# Patient Record
Sex: Female | Born: 1955 | Hispanic: No | Marital: Married | Smoking: Never smoker
Health system: Southern US, Community
[De-identification: ages and names within clinical notes are randomized; demographics above are authoritative.]

## PROBLEM LIST (undated history)

## (undated) DIAGNOSIS — K635 Polyp of colon: Secondary | ICD-10-CM

## (undated) DIAGNOSIS — N920 Excessive and frequent menstruation with regular cycle: Secondary | ICD-10-CM

## (undated) DIAGNOSIS — K589 Irritable bowel syndrome without diarrhea: Secondary | ICD-10-CM

## (undated) DIAGNOSIS — D259 Leiomyoma of uterus, unspecified: Secondary | ICD-10-CM

## (undated) HISTORY — DX: Irritable bowel syndrome, unspecified: K58.9

## (undated) HISTORY — PX: NO PAST SURGERIES: SHX2092

## (undated) HISTORY — DX: Leiomyoma of uterus, unspecified: D25.9

## (undated) HISTORY — DX: Polyp of colon: K63.5

## (undated) HISTORY — DX: Excessive and frequent menstruation with regular cycle: N92.0

---

## 1995-10-10 HISTORY — PX: BREAST BIOPSY: SHX20

## 2005-03-16 ENCOUNTER — Ambulatory Visit: Payer: Self-pay | Admitting: Obstetrics and Gynecology

## 2008-02-08 ENCOUNTER — Ambulatory Visit: Payer: Self-pay

## 2009-04-16 ENCOUNTER — Ambulatory Visit: Payer: Self-pay | Admitting: Internal Medicine

## 2010-04-17 ENCOUNTER — Ambulatory Visit: Payer: Self-pay | Admitting: Internal Medicine

## 2011-03-11 ENCOUNTER — Ambulatory Visit: Payer: Self-pay | Admitting: Physician Assistant

## 2011-06-09 ENCOUNTER — Ambulatory Visit: Payer: Self-pay | Admitting: Internal Medicine

## 2012-06-15 ENCOUNTER — Ambulatory Visit: Payer: Self-pay | Admitting: Family Medicine

## 2013-07-19 ENCOUNTER — Ambulatory Visit: Payer: Self-pay | Admitting: Family Medicine

## 2013-09-26 ENCOUNTER — Ambulatory Visit: Payer: Self-pay | Admitting: Family Medicine

## 2013-10-09 ENCOUNTER — Ambulatory Visit: Payer: Self-pay | Admitting: Family Medicine

## 2013-11-14 ENCOUNTER — Ambulatory Visit: Payer: Self-pay | Admitting: Family Medicine

## 2013-12-19 ENCOUNTER — Ambulatory Visit: Payer: Self-pay | Admitting: Family Medicine

## 2014-11-15 ENCOUNTER — Ambulatory Visit: Payer: Self-pay | Admitting: Family Medicine

## 2016-06-19 ENCOUNTER — Other Ambulatory Visit: Payer: Self-pay | Admitting: Family Medicine

## 2016-06-19 DIAGNOSIS — Z1231 Encounter for screening mammogram for malignant neoplasm of breast: Secondary | ICD-10-CM

## 2016-07-07 ENCOUNTER — Ambulatory Visit: Payer: Self-pay

## 2016-09-02 ENCOUNTER — Ambulatory Visit
Admission: RE | Admit: 2016-09-02 | Discharge: 2016-09-02 | Disposition: A | Discharge status: Home / Self Care | Payer: BC Managed Care – PPO | Source: Ambulatory Visit | Attending: Family Medicine | Admitting: Family Medicine

## 2016-09-02 DIAGNOSIS — Z1231 Encounter for screening mammogram for malignant neoplasm of breast: Secondary | ICD-10-CM | POA: Insufficient documentation

## 2016-10-26 ENCOUNTER — Encounter: Payer: Self-pay | Admitting: Family Medicine

## 2016-10-26 ENCOUNTER — Ambulatory Visit (INDEPENDENT_AMBULATORY_CARE_PROVIDER_SITE_OTHER): Payer: BC Managed Care – PPO | Admitting: Family Medicine

## 2016-10-26 VITALS — BP 148/90 | Temp 98.5°F | Ht 65.0 in | Wt 193.0 lb

## 2016-10-26 DIAGNOSIS — J019 Acute sinusitis, unspecified: Secondary | ICD-10-CM | POA: Diagnosis not present

## 2016-10-26 MED ORDER — HYDROCODONE-HOMATROPINE 5-1.5 MG/5ML PO SYRP: ORAL_SOLUTION | ORAL | 0 refills | Status: DC

## 2016-10-26 MED ORDER — LEVOFLOXACIN 500 MG PO TABS: 500.0000 mg | ORAL_TABLET | Freq: Every day | ORAL | 0 refills | Status: DC

## 2016-10-26 NOTE — Progress Notes (Signed)
   Subjective:    Patient ID: Melissa Aguilar, female    DOB: 1955/12/07, 60 y.o.   MRN: [id number]  Sinusitis  This is a new problem. The current episode started more than 1 month ago. Associated symptoms include congestion, coughing, headaches and a sore throat. (Fever, wheezing) Treatments tried: hycodan, mucinex.   No asthma  No hx resp stuff for some time  [employer] exposures   Positive headache frontal in nature positive nasal congestion discharge 6 is been going on for several weeks. Saw another primary clinician was not given antibiotics. Now getting worse  chl  Review of Systems  HENT: Positive for congestion and sore throat.   Respiratory: Positive for cough.   Neurological: Positive for headaches.       Objective:   Physical Exam Alert, mild malaise. Hydration good Vitals stable. frontal/ maxillary tenderness evident positive nasal congestion. pharynx normal neck supple  lungs clear/no crackles or wheezes. heart regular in rhythm        Assessment & Plan:  SubacuteImpression rhinosinusitis likely post viral, discussed with patient. plan antibiotics prescribed. Questions answered. Symptomatic care discussed. warning signs discussed. WSL

## 2016-10-27 ENCOUNTER — Encounter: Payer: Self-pay | Admitting: Family Medicine

## 2016-12-15 ENCOUNTER — Encounter: Payer: Self-pay | Admitting: Family Medicine

## 2016-12-15 ENCOUNTER — Ambulatory Visit (INDEPENDENT_AMBULATORY_CARE_PROVIDER_SITE_OTHER): Payer: BC Managed Care – PPO | Admitting: Family Medicine

## 2016-12-15 VITALS — Temp 99.0°F | Ht 65.0 in | Wt 194.2 lb

## 2016-12-15 DIAGNOSIS — J019 Acute sinusitis, unspecified: Secondary | ICD-10-CM | POA: Diagnosis not present

## 2016-12-15 DIAGNOSIS — J209 Acute bronchitis, unspecified: Secondary | ICD-10-CM

## 2016-12-15 MED ORDER — ZOLPIDEM TARTRATE 5 MG PO TABS: 5.0000 mg | ORAL_TABLET | Freq: Every evening | ORAL | 1 refills | Status: DC | PRN

## 2016-12-15 MED ORDER — CLARITHROMYCIN 500 MG PO TABS: 500.0000 mg | ORAL_TABLET | Freq: Two times a day (BID) | ORAL | 0 refills | Status: DC

## 2016-12-15 NOTE — Progress Notes (Signed)
   Subjective:    Patient ID: Melissa Aguilar, female    DOB: [birth year], 61 y.o.   MRN: [id number]  HPI Patient arrives with continued cough since November. Patient states she got better with Levaquin and hycodan but it has come back  achey and cough anc cong   Dim enrgy   Pos kidney stone  Pos cough prod   Non smoker   Clearing throat plus chough  Not sleeping as well   Zolpidem five qhs prn sleep    Some nasal cong and disch   Review of Systems No headache, no major weight loss or weight gain, no chest pain no back pain abdominal pain no change in bowel habits complete ROS otherwise negative     Objective:   Physical Exam  Alert, mild malaise. Hydration good Vitals stable. frontal/ maxillary tenderness evident positive nasal congestion. pharynx normal neck supple  lungs clear/no crackles or wheezes. heart regular in rhythm       Assessment & Plan:  Impression rhinosinusitis /Bronchitis with subacute duration at this time. likely post viral, discussed with patient. plan antibiotics prescribed. Questions answered. Symptomatic care discussed. warning signs discussed. WSL

## 2016-12-16 ENCOUNTER — Telehealth: Payer: Self-pay | Admitting: Family Medicine

## 2016-12-16 MED ORDER — HYDROCODONE-HOMATROPINE 5-1.5 MG/5ML PO SYRP: ORAL_SOLUTION | ORAL | 0 refills | Status: DC

## 2016-12-16 NOTE — Telephone Encounter (Signed)
Left message return call (prescription printed) 12/16/16

## 2016-12-16 NOTE — Telephone Encounter (Signed)
Hycodan three oz one tpn qhd prn cough

## 2016-12-16 NOTE — Telephone Encounter (Signed)
Spoke with patient and informed her per Dr.Steve Luking- prescription for Hycodan cough syrup 1 teaspoon at bedtime was ready for pick up. Patient verbalized understanding.

## 2016-12-16 NOTE — Telephone Encounter (Signed)
Patient saw Dr. Richardson Landry yesterday and forgot to get cough meds.  Would like the Hydrocodone cough syrup called in to [facility] in [place].  Patient can be reached at [phone].  Thx

## 2017-02-01 ENCOUNTER — Ambulatory Visit (INDEPENDENT_AMBULATORY_CARE_PROVIDER_SITE_OTHER): Payer: BC Managed Care – PPO | Admitting: Nurse Practitioner

## 2017-02-01 ENCOUNTER — Encounter: Payer: Self-pay | Admitting: Nurse Practitioner

## 2017-02-01 VITALS — BP 128/82 | Ht 65.0 in | Wt 196.2 lb

## 2017-02-01 DIAGNOSIS — L03119 Cellulitis of unspecified part of limb: Secondary | ICD-10-CM

## 2017-02-01 DIAGNOSIS — L02519 Cutaneous abscess of unspecified hand: Secondary | ICD-10-CM

## 2017-02-01 DIAGNOSIS — L239 Allergic contact dermatitis, unspecified cause: Secondary | ICD-10-CM

## 2017-02-01 MED ORDER — DOXYCYCLINE HYCLATE 100 MG PO TABS: 100.0000 mg | ORAL_TABLET | Freq: Two times a day (BID) | ORAL | 0 refills | Status: DC

## 2017-02-01 MED ORDER — PREDNISONE 20 MG PO TABS: ORAL_TABLET | ORAL | 0 refills | Status: DC

## 2017-02-01 MED ORDER — TRIAMCINOLONE ACETONIDE 0.1 % EX CREA: 1.0000 "application " | TOPICAL_CREAM | Freq: Two times a day (BID) | CUTANEOUS | 0 refills | Status: DC

## 2017-02-01 NOTE — Progress Notes (Signed)
Subjective:  Presents today with potential insect bite on left hand.  Was hauling wood with gloves one week ago, did not notice any scrape or feel a bite, but noticed some itching and swelling on dorsal surface the following day.  The itching, swelling, and watery drainage has gotten gradually worse despite the application of topical antibiotics or braggs vinegar.  Denies any fever, chills, pain, numbness or tingling.  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    Objective:   BP 128/82   Ht 5\' 5"  (1.651 m)   Wt 196 lb 3.2 oz (89 kg)   BMI 32.65 kg/m  Alert and Oriented, NAD. Skin: The center of the dorsal surface of the hand is erythematous and slightly edematous with a small scab in the center. Minimal tenderness.  Well defined edges with one area of small linear vesicles and erythema. Two small pink linear papular areas on the hand.    Assessment:  Allergic contact dermatitis, unspecified trigger  Cellulitis and abscess of hand  Plan:   Meds ordered this encounter  Medications  . doxycycline (VIBRA-TABS) 100 MG tablet    Sig: Take 1 tablet (100 mg total) by mouth 2 (two) times daily.    Dispense:  14 tablet    Refill:  0    Order Specific Question:   Supervising Provider    Answer:   Mikey Kirschner [[id number]]  . triamcinolone cream (KENALOG) 0.1 %    Sig: Apply 1 application topically 2 (two) times daily. Prn rash; use up to 2 weeks    Dispense:  30 g    Refill:  0    Order Specific Question:   Supervising Provider    Answer:   Mikey Kirschner [[id number]]  . predniSONE (DELTASONE) 20 MG tablet    Sig: 3 po qd x 3 d then 2 po qd x 3 d then 1 po qd x 2 d    Dispense:  17 tablet    Refill:  0    Order Specific Question:   Supervising Provider    Answer:   Mikey Kirschner [[id number]]   Unclear etiology of original injury to hand.  Suspect insect bite vs. Contact dermatitis.  Begin Doxycyclkine 100mg  BID for secondary infection. Begin OTC Loratadine 10mg  in the morning  and 25mg  Benadryl at bedtime for symptom management.    Initiate topical steroid cream and give RX for oral prednisone to start only if topical cream does not help.    Symptom management and warning signs reviewed with patient.  Return if symptoms worsen or fail to improve.

## 2017-02-01 NOTE — Patient Instructions (Signed)
Loratadine 10 mg in AM Benadryl 25mg  at bedtime   Use the topical steroid now, will given oral steroid prescription if symptoms get worse

## 2017-02-03 ENCOUNTER — Encounter: Payer: Self-pay | Admitting: Nurse Practitioner

## 2017-04-19 ENCOUNTER — Ambulatory Visit: Payer: BC Managed Care – PPO | Admitting: Family Medicine

## 2017-05-03 ENCOUNTER — Telehealth: Payer: Self-pay | Admitting: Family Medicine

## 2017-05-03 MED ORDER — ZOLPIDEM TARTRATE 5 MG PO TABS: 5.0000 mg | ORAL_TABLET | Freq: Every evening | ORAL | 5 refills | Status: DC | PRN

## 2017-05-03 NOTE — Telephone Encounter (Signed)
Patient is needing refill for Zolipdem to [facility].

## 2017-05-03 NOTE — Telephone Encounter (Signed)
Ok si x m0o worth

## 2017-05-03 NOTE — Telephone Encounter (Signed)
Prescription faxed to pharmacy. Patient notified. 

## 2017-05-04 ENCOUNTER — Telehealth: Payer: Self-pay | Admitting: *Deleted

## 2017-05-04 MED ORDER — ZOLPIDEM TARTRATE 5 MG PO TABS: 5.0000 mg | ORAL_TABLET | Freq: Every evening | ORAL | 2 refills | Status: DC | PRN

## 2017-05-04 NOTE — Telephone Encounter (Signed)
79 for 75 d with 2 ref

## 2017-05-04 NOTE — Telephone Encounter (Signed)
Fax from [facility]. Prior auth required for ambien 5mg  one po qhs prn sleep. OR insurance will cover #45 for 75 days. Please advise if you want to change to #45 or do PA.

## 2017-05-04 NOTE — Telephone Encounter (Signed)
Prescription reprinted accordingly.

## 2017-05-06 ENCOUNTER — Ambulatory Visit: Payer: BC Managed Care – PPO | Admitting: Family Medicine

## 2017-05-21 ENCOUNTER — Other Ambulatory Visit: Payer: Self-pay | Admitting: *Deleted

## 2017-05-21 ENCOUNTER — Telehealth: Payer: Self-pay | Admitting: Family Medicine

## 2017-05-21 MED ORDER — PREDNISONE 20 MG PO TABS: ORAL_TABLET | ORAL | 0 refills | Status: DC

## 2017-05-21 NOTE — Telephone Encounter (Signed)
Adult pred taper 

## 2017-05-21 NOTE — Telephone Encounter (Signed)
Spoke with patient and informed her per Dr.Steve Luking- Prednisone was sent into pharmacy.  Patient verbalized understanding.

## 2017-05-21 NOTE — Telephone Encounter (Signed)
Patient is requesting Rx for prednisone due to having poison ivy on her legs.   [facility] in [place], [place]

## 2017-05-21 NOTE — Telephone Encounter (Signed)
Left message return call 05/21/2017 (medication sent into pharmacy)

## 2017-08-16 ENCOUNTER — Other Ambulatory Visit: Payer: Self-pay | Admitting: Family Medicine

## 2017-08-16 DIAGNOSIS — Z1231 Encounter for screening mammogram for malignant neoplasm of breast: Secondary | ICD-10-CM

## 2017-09-06 ENCOUNTER — Encounter: Payer: Self-pay | Admitting: Radiology

## 2017-09-06 ENCOUNTER — Ambulatory Visit
Admission: RE | Admit: 2017-09-06 | Discharge: 2017-09-06 | Disposition: A | Discharge status: Home / Self Care | Payer: BC Managed Care – PPO | Source: Ambulatory Visit | Attending: Family Medicine | Admitting: Family Medicine

## 2017-09-06 DIAGNOSIS — R921 Mammographic calcification found on diagnostic imaging of breast: Secondary | ICD-10-CM | POA: Insufficient documentation

## 2017-09-06 DIAGNOSIS — Z1231 Encounter for screening mammogram for malignant neoplasm of breast: Secondary | ICD-10-CM | POA: Diagnosis not present

## 2017-09-08 ENCOUNTER — Other Ambulatory Visit: Payer: Self-pay | Admitting: Family Medicine

## 2017-09-08 DIAGNOSIS — R928 Other abnormal and inconclusive findings on diagnostic imaging of breast: Secondary | ICD-10-CM

## 2017-09-08 DIAGNOSIS — R921 Mammographic calcification found on diagnostic imaging of breast: Secondary | ICD-10-CM

## 2017-09-16 ENCOUNTER — Encounter: Payer: Self-pay | Admitting: Advanced Practice Midwife

## 2017-09-16 ENCOUNTER — Ambulatory Visit (INDEPENDENT_AMBULATORY_CARE_PROVIDER_SITE_OTHER): Payer: BC Managed Care – PPO | Admitting: Advanced Practice Midwife

## 2017-09-16 VITALS — BP 144/82 | HR 90 | Ht 65.5 in | Wt 198.0 lb

## 2017-09-16 DIAGNOSIS — Z01419 Encounter for gynecological examination (general) (routine) without abnormal findings: Secondary | ICD-10-CM | POA: Diagnosis not present

## 2017-09-16 DIAGNOSIS — F419 Anxiety disorder, unspecified: Secondary | ICD-10-CM | POA: Insufficient documentation

## 2017-09-16 DIAGNOSIS — Z124 Encounter for screening for malignant neoplasm of cervix: Secondary | ICD-10-CM

## 2017-09-16 DIAGNOSIS — K589 Irritable bowel syndrome without diarrhea: Secondary | ICD-10-CM | POA: Insufficient documentation

## 2017-09-16 DIAGNOSIS — G4700 Insomnia, unspecified: Secondary | ICD-10-CM | POA: Insufficient documentation

## 2017-09-16 DIAGNOSIS — R7303 Prediabetes: Secondary | ICD-10-CM | POA: Insufficient documentation

## 2017-09-16 LAB — HM PAP SMEAR: HM Pap smear: NEGATIVE

## 2017-09-16 NOTE — Progress Notes (Signed)
Gynecology Annual Exam  PCP: Mikey Kirschner, MD  Chief Complaint:  Chief Complaint  Patient presents with  . Gynecologic Exam    History of Present Illness:Patient is a 61 y.o. G2P0011 presents for annual exam. The patient has no complaints today. She requests a refill of her progesterone 3% HRT cream. She has been using that 2-3 x per week in the past year. She still has her IUD from 2014 and prefers to keep it in for the full 5 years  LMP: No LMP recorded. Patient is postmenopausal.   Postcoital Bleeding: no Dysmenorrhea: not applicable   The patient is sexually active. She denies dyspareunia.  The patient does not perform self breast exams.  There is no notable family history of breast or ovarian cancer in her family.  The patient wears seatbelts: yes.   The patient has regular exercise: yes.    The patient denies current symptoms of depression. She does continue to have anxiety but that is improved since she retired from her job. She rarely takes her valium.  Review of Systems: Review of Systems  Constitutional: Negative.   HENT: Negative.   Eyes: Negative.   Respiratory: Negative.   Cardiovascular: Negative.   Gastrointestinal: Negative.   Genitourinary: Negative.   Musculoskeletal: Negative.   Skin: Negative.   Neurological: Negative.   Endo/Heme/Allergies: Negative.   Psychiatric/Behavioral: Negative.     Past Medical History:  History reviewed. No pertinent past medical history.  Past Surgical History:  Past Surgical History:  Procedure Laterality Date  . BREAST BIOPSY Left 10/1995   neg    Gynecologic History:  No LMP recorded. Patient is postmenopausal. Last Pap: 1 year ago Results were:  no abnormalities  Last mammogram: in the past month Results were: BI-RAD I right breast, inconclusive with f/u imaging for left breast next week  Obstetric History: G2P0011  Family History:  History reviewed. No pertinent family history.  Social History:   Social History   Socioeconomic History  . Marital status: Married    Spouse name: Not on file  . Number of children: Not on file  . Years of education: Not on file  . Highest education level: Not on file  Social Needs  . Financial resource strain: Not on file  . Food insecurity - worry: Not on file  . Food insecurity - inability: Not on file  . Transportation needs - medical: Not on file  . Transportation needs - non-medical: Not on file  Occupational History  . Not on file  Tobacco Use  . Smoking status: Never Smoker  . Smokeless tobacco: Never Used  Substance and Sexual Activity  . Alcohol use: Yes  . Drug use: No  . Sexual activity: Yes    Birth control/protection: Post-menopausal  Other Topics Concern  . Not on file  Social History Narrative  . Not on file    Allergies:  Allergies  Allergen Reactions  . Amoxil [Amoxicillin]     swelling  . Augmentin [Amoxicillin-Pot Clavulanate]     swelling  . Paxil [Paroxetine Hcl]     confusion    Medications: Prior to Admission medications   Medication Sig Start Date End Date Taking? Authorizing Provider  diazepam (VALIUM) 5 MG tablet Take 5 mg by mouth every 12 (twelve) hours as needed for anxiety.   Yes [provider]  dicyclomine (BENTYL) 20 MG tablet Take 20 mg by mouth 4 (four) times daily.   Yes [provider]  zolpidem Lorrin Mais)  5 MG tablet Take 1 tablet (5 mg total) by mouth at bedtime as needed for sleep. 05/04/17  Yes Mikey Kirschner, MD  doxycycline (VIBRA-TABS) 100 MG tablet Take 1 tablet (100 mg total) by mouth 2 (two) times daily. Patient not taking: Reported on 09/16/2017 02/01/17   Nilda Simmer, NP  HYDROcodone-homatropine [facility]) 5-1.5 MG/5ML syrup Take 1 teaspoon by mouth at bedtime as needed for cough Patient not taking: Reported on 09/16/2017 12/16/16   Mikey Kirschner, MD  predniSONE (DELTASONE) 20 MG tablet 3 po qd x 3 d then 2 po qd x 3 d then 1 po qd x 2 d Patient not  taking: Reported on 09/16/2017 05/21/17   Mikey Kirschner, MD  triamcinolone cream (KENALOG) 0.1 % Apply 1 application topically 2 (two) times daily. Prn rash; use up to 2 weeks Patient not taking: Reported on 09/16/2017 02/01/17   Nilda Simmer, NP    Physical Exam Vitals: Blood pressure (!) 144/82, pulse 90, height 5' 5.5" (1.664 m), weight 198 lb (89.8 kg).  General: NAD HEENT: normocephalic, anicteric Thyroid: no enlargement, no palpable nodules Pulmonary: No increased work of breathing, CTAB Cardiovascular: RRR, distal pulses 2+ Breast: Breast symmetrical, no tenderness, no palpable nodules or masses, no skin or nipple retraction present, no nipple discharge.  No axillary or supraclavicular lymphadenopathy. Abdomen: NABS, soft, non-tender, non-distended.  Umbilicus without lesions.  No hepatomegaly, splenomegaly or masses palpable. No evidence of hernia  Genitourinary:  External: Normal external female genitalia.  Normal urethral meatus, normal  Bartholin's and Skene's glands.    Vagina: Normal vaginal mucosa, no evidence of prolapse.    Cervix: Grossly normal in appearance, no bleeding, no CMT, strings visualized 2 cm  Uterus: Non-enlarged, mobile, normal contour.    Adnexa: ovaries non-enlarged, no adnexal masses  Rectal: deferred  Lymphatic: no evidence of inguinal lymphadenopathy Extremities: no edema, erythema, or tenderness Neurologic: Grossly intact Psychiatric: mood appropriate, affect full    Assessment: 61 y.o. G2P0011 routine annual exam  Plan: Problem List Items Addressed This Visit    None    Visit Diagnoses    Well woman exam with routine gynecological exam    -  Primary   Relevant Orders   IGP, Aptima HPV   Cervical cancer screening       Relevant Orders   IGP, Aptima HPV      1) Mammogram - recommend yearly screening mammogram.  Mammogram Is up to date  2) STI screening was offered and declined  3) ASCCP guidelines and rational discussed.   Patient opts for yearly screening interval  4) Osteoporosis  - per USPTF routine screening DEXA at age 2   Consider FDA-approved medical therapies in postmenopausal women and men aged 52 years and older, based on the following: a) A hip or vertebral (clinical or morphometric) fracture b) T-score ? -2.5 at the femoral neck or spine after appropriate evaluation to exclude secondary causes C) Low bone mass (T-score between -1.0 and -2.5 at the femoral neck or spine) and a 10-year probability of a hip fracture ? 3% or a 10-year probability of a major osteoporosis-related fracture ? 20% based on the [place]-adapted WHO algorithm   5) Routine healthcare maintenance including cholesterol, diabetes screening discussed managed by PCP  6) Colonoscopy Up to Date.  Screening recommended starting at age 64 for average risk individuals, age 59 for individuals deemed at increased risk (including African Americans) and recommended to continue until age 57.  For patient age 27-85  individualized approach is recommended.  Gold standard screening is via colonoscopy, Cologuard screening is an acceptable alternative for patient unwilling or unable to undergo colonoscopy.  "Colorectal cancer screening for average?risk adults: 2018 guideline update from the American Cancer Society"CA: A Cancer Journal for Clinicians: Apr 07, 2017   7) Rx for Progesterone 3% HRT cream called in to [facility] per patient request  8) Follow up 1 year for routine annual  Rod [place], [place]

## 2017-09-20 LAB — IGP, APTIMA HPV
HPV APTIMA: NEGATIVE
PAP Smear Comment: 0

## 2017-09-23 ENCOUNTER — Other Ambulatory Visit: Payer: BC Managed Care – PPO

## 2017-09-23 ENCOUNTER — Ambulatory Visit: Payer: BC Managed Care – PPO

## 2017-10-07 ENCOUNTER — Other Ambulatory Visit: Payer: Self-pay | Admitting: Family Medicine

## 2017-10-07 ENCOUNTER — Ambulatory Visit
Admission: RE | Admit: 2017-10-07 | Discharge: 2017-10-07 | Disposition: A | Discharge status: Home / Self Care | Payer: BC Managed Care – PPO | Source: Ambulatory Visit | Attending: Family Medicine | Admitting: Family Medicine

## 2017-10-07 DIAGNOSIS — R928 Other abnormal and inconclusive findings on diagnostic imaging of breast: Secondary | ICD-10-CM | POA: Diagnosis present

## 2017-10-07 DIAGNOSIS — R921 Mammographic calcification found on diagnostic imaging of breast: Secondary | ICD-10-CM

## 2017-10-07 DIAGNOSIS — N6322 Unspecified lump in the left breast, upper inner quadrant: Secondary | ICD-10-CM | POA: Insufficient documentation

## 2017-11-01 ENCOUNTER — Telehealth: Payer: Self-pay | Admitting: Family Medicine

## 2017-11-01 NOTE — Telephone Encounter (Signed)
Pt requesting refill on diazepam (VALIUM) 5 MG tablet  Pt states last filled in 2016 from [facility]  Please advise & call pt (pt states Dr. Richardson Landry knows her history & could she get this refilled or would she need to be seen)   [facility]

## 2017-11-01 NOTE — Telephone Encounter (Signed)
Patient has not had a med check in Epic and we do not prescribe this medication. Patient needs office visit to discuss. Patient verbalized understanding and scheduled follow up office visit.

## 2017-11-10 ENCOUNTER — Ambulatory Visit: Payer: BC Managed Care – PPO | Admitting: Family Medicine

## 2017-11-10 ENCOUNTER — Encounter: Payer: Self-pay | Admitting: Family Medicine

## 2017-11-10 VITALS — BP 134/90 | Ht 65.5 in | Wt 197.0 lb

## 2017-11-10 DIAGNOSIS — F5101 Primary insomnia: Secondary | ICD-10-CM

## 2017-11-10 DIAGNOSIS — F411 Generalized anxiety disorder: Secondary | ICD-10-CM | POA: Diagnosis not present

## 2017-11-10 MED ORDER — ZOLPIDEM TARTRATE 5 MG PO TABS: 5.0000 mg | ORAL_TABLET | Freq: Every evening | ORAL | 2 refills | Status: DC | PRN

## 2017-11-10 MED ORDER — DIAZEPAM 5 MG PO TABS: ORAL_TABLET | ORAL | 2 refills | Status: DC

## 2017-11-10 NOTE — Progress Notes (Signed)
   Subjective:    Patient ID: Melissa Aguilar, female    DOB: 09-Oct-1956, 62 y.o.   MRN: [id number]  Anxiety  Presents for follow-up visit.    takes valium every other day. Under a lot of stress with inlaws having to go to nursing homes.   Right foot pain for months. Has been huritng for months. Has purchased orthotics, walks with shoes and clogs   Pt has hx of anxiety. Uses old valium  The past few mo very stress ful with anxidty and stress. Family facing nursing home challenges  Patient compliant with insomnia medication. Generally takes most nights. No obvious morning drowsiness. Definitely helps patient sleep. Without it patient states would not get a good nights rest.  Uses prn  asneeded  Uses prn valium and ambien but does not misx  Usually right n hurts the most    Review of Systems No headache, no major weight loss or weight gain, no chest pain no back pain abdominal pain no change in bowel habits complete ROS otherwise negative     Objective:   Physical Exam Alert vitals stable, NAD. Blood pressure good on repeat. HEENT normal. Lungs clear. Heart regular rate and rhythm.        Assessment & Plan:  Impression 1 insomnia.  Occasional use of Ambien.  Will maintain  2.  Chronic anxiety with considerable stress right now.  Uses as needed occasional.  Side effects benefits discussed will prescribe  3.  Plantar fasciitis.  Petra Kuba of condition discussed wear over-the-counter orthotics in all shoes and stretching exercises encourage use Aleve as needed

## 2018-01-21 ENCOUNTER — Ambulatory Visit: Payer: BC Managed Care – PPO | Admitting: Family Medicine

## 2018-01-21 ENCOUNTER — Encounter: Payer: Self-pay | Admitting: Family Medicine

## 2018-01-21 VITALS — BP 142/88 | Temp 99.0°F | Ht 65.5 in | Wt 197.0 lb

## 2018-01-21 DIAGNOSIS — J111 Influenza due to unidentified influenza virus with other respiratory manifestations: Secondary | ICD-10-CM | POA: Diagnosis not present

## 2018-01-21 MED ORDER — HYDROCODONE-HOMATROPINE 5-1.5 MG/5ML PO SYRP: ORAL_SOLUTION | ORAL | 0 refills | Status: DC

## 2018-01-21 MED ORDER — OSELTAMIVIR PHOSPHATE 75 MG PO CAPS: 75.0000 mg | ORAL_CAPSULE | Freq: Two times a day (BID) | ORAL | 0 refills | Status: DC

## 2018-01-21 NOTE — Progress Notes (Signed)
   Subjective:    Patient ID: Annitta Needs, female    DOB: 1956-02-05, 62 y.o.   MRN: [id number]  Sinusitis  This is a new problem. Episode onset: 48 hours. Associated symptoms include congestion, coughing, headaches and a sore throat. (Fever, wheezing, body aches) Treatments tried: tea, lemon juice, hycodan, aleve.  firat iticed cough   Coughing hit hard  Non stop   drining tea and lempn and old cough med   achiness in the body  Energy level    Not a flu shot   Headache from the cough non sto [    Influenza  Higher temp  Bd body ache  energet ic     Review of Systems  HENT: Positive for congestion and sore throat.   Respiratory: Positive for cough.   Neurological: Positive for headaches.       Objective:   Physical Exam   Alert vitals reviewed, moderate malaise. Hydration good. Positive nasal congestion lungs no crackles or wheezes, no tachypnea, intermittent bronchial cough during exam heart regular rate and rhythm.      Assessment & Plan:  Impression influenza discussed at length. Petra Kuba of illness and potential sequela discussed. Plan Tamiflu prescribed if indicated and timing appropriate. Symptom care discussed. Warning signs discussed. WSL

## 2018-01-24 ENCOUNTER — Telehealth: Payer: Self-pay | Admitting: Family Medicine

## 2018-01-24 NOTE — Telephone Encounter (Signed)
Left message to return call 

## 2018-01-24 NOTE — Telephone Encounter (Signed)
Patient was here on Friday and saw Dr. Richardson Landry. Diagnosed with the flu and given tamiflu. Took one dose on Friday and it made her sick and she threw up.  She threw medicine away.  She is feeling better today and I told her probably nothing else to do at this point but she wanted advice.

## 2018-01-25 NOTE — Telephone Encounter (Signed)
Left message to return call 

## 2018-01-31 NOTE — Telephone Encounter (Signed)
I called and left a message to r/c. 

## 2018-04-26 ENCOUNTER — Telehealth: Payer: Self-pay

## 2018-04-26 NOTE — Telephone Encounter (Signed)
Spoke w/pt. She needs rf of Progesterone & would like it sent to [facility] in [place]. Put on file.

## 2018-04-26 NOTE — Telephone Encounter (Signed)
[facility] has closed. Pt can't get her progesterone rx filled d/t that. Pt requesting [phone]

## 2018-04-27 ENCOUNTER — Other Ambulatory Visit: Payer: Self-pay | Admitting: Advanced Practice Midwife

## 2018-04-27 DIAGNOSIS — Z79890 Hormone replacement therapy (postmenopausal): Secondary | ICD-10-CM

## 2018-04-27 MED ORDER — HRT CREAM BASE CREA: 0.5000 mL | TOPICAL_CREAM | 3 refills | Status: DC

## 2018-04-27 NOTE — Telephone Encounter (Signed)
Please let patient know that her Rx has been sent to preferred pharmacy

## 2018-04-27 NOTE — Telephone Encounter (Signed)
Pt aware.

## 2018-04-27 NOTE — Progress Notes (Signed)
Rx sent to patient pharmacy per patient request

## 2018-06-03 ENCOUNTER — Other Ambulatory Visit: Payer: Self-pay | Admitting: Family Medicine

## 2018-06-03 NOTE — Telephone Encounter (Signed)
Pt let her script expire and she would like to know if she can get a new script for that one refill and do a follow up after that.

## 2018-07-01 ENCOUNTER — Other Ambulatory Visit: Payer: Self-pay | Admitting: Family Medicine

## 2018-07-01 DIAGNOSIS — Z1231 Encounter for screening mammogram for malignant neoplasm of breast: Secondary | ICD-10-CM

## 2018-07-13 ENCOUNTER — Ambulatory Visit: Payer: BC Managed Care – PPO | Admitting: Family Medicine

## 2018-07-15 ENCOUNTER — Encounter: Payer: Self-pay | Admitting: Family Medicine

## 2018-07-15 ENCOUNTER — Ambulatory Visit: Payer: BC Managed Care – PPO | Admitting: Family Medicine

## 2018-07-15 VITALS — BP 128/84 | Ht 65.5 in | Wt 194.6 lb

## 2018-07-15 DIAGNOSIS — F5101 Primary insomnia: Secondary | ICD-10-CM

## 2018-07-15 MED ORDER — ZOLPIDEM TARTRATE 5 MG PO TABS: 5.0000 mg | ORAL_TABLET | Freq: Every evening | ORAL | 5 refills | Status: DC | PRN

## 2018-07-15 NOTE — Progress Notes (Signed)
   Subjective:    Patient ID: Melissa Aguilar, female    DOB: Sep 22, 1956, 62 y.o.   MRN: [id number]  HPI Patient arrives for a follow up on insomnia. Patient Aguilar a refill on her Ambien.   Pt retired now, less stress.    Takes ambien about 2x per week.    Sleeping overall well... Better than it used to be.  Wakes up at 4:30 am to get husband off to work.  Goes to sleep at 9:30-10 pm.  Takes 1 nap for about 1 hour in the morning.  Wakes up once per night to go to the bathroom, falls back asleep well.  Reports having to get up and move around occasionally due to chronic lower back pain. Mood is stable, rare use of valium for anxiety, knows not to combine valium and ambien use.    Review of Systems  Constitutional: Positive for fatigue.  Psychiatric/Behavioral: Positive for sleep disturbance.  All other systems reviewed and are negative.      Objective:   Physical Exam  Constitutional: She is oriented to person, place, and time. She appears well-developed and well-nourished. No distress.  HENT:  Head: Normocephalic and atraumatic.  Eyes: Right eye exhibits no discharge. Left eye exhibits no discharge.  Neck: Neck supple.  Cardiovascular: Normal rate, regular rhythm and normal heart sounds.  Pulmonary/Chest: Effort normal and breath sounds normal.  Lymphadenopathy:    She has no cervical adenopathy.  Neurological: She is alert and oriented to person, place, and time.  Skin: Skin is warm and dry.  Psychiatric: She has a normal mood and affect. Her behavior is normal. Judgment and thought content normal.  Vitals reviewed.     Assessment & Plan:  Primary insomnia Doing well with occasional use of ambien, will refill rx.  Encouraged regular exercise and stretches for lower back.  Encouraged wellness exam, states she gets this done with Dr. Netty Starring. Fatigue has been addressed and evaluated by Dr. Netty Starring. Requested pt have a copy of her labs sent to our office. Pt to follow  up as needed.  As attending physician to this patient visit, this patient was seen in conjunction with the nurse practitioner.  The history,physical and treatment plan was reviewed with the nurse practitioner and pertinent findings along with treatment plan was reviewed with the patient while they were present. Refills of her Ambien was ordered follow-up 6 months

## 2018-07-15 NOTE — Patient Instructions (Signed)
Insomnia Insomnia is a sleep disorder that makes it difficult to fall asleep or to stay asleep. Insomnia can cause tiredness (fatigue), low energy, difficulty concentrating, mood swings, and poor performance at work or school. There are three different ways to classify insomnia:  Difficulty falling asleep.  Difficulty staying asleep.  Waking up too early in the morning.  Any type of insomnia can be long-term (chronic) or short-term (acute). Both are common. Short-term insomnia usually lasts for three months or less. Chronic insomnia occurs at least three times a week for longer than three months. What are the causes? Insomnia may be caused by another condition, situation, or substance, such as:  Anxiety.  Certain medicines.  Gastroesophageal reflux disease (GERD) or other gastrointestinal conditions.  Asthma or other breathing conditions.  Restless legs syndrome, sleep apnea, or other sleep disorders.  Chronic pain.  Menopause. This may include hot flashes.  Stroke.  Abuse of alcohol, tobacco, or illegal drugs.  Depression.  Caffeine.  Neurological disorders, such as Alzheimer disease.  An overactive thyroid (hyperthyroidism).  The cause of insomnia may not be known. What increases the risk? Risk factors for insomnia include:  Gender. Women are more commonly affected than men.  Age. Insomnia is more common as you get older.  Stress. This may involve your professional or personal life.  Income. Insomnia is more common in people with lower income.  Lack of exercise.  Irregular work schedule or night shifts.  Traveling between different time zones.  What are the signs or symptoms? If you have insomnia, trouble falling asleep or trouble staying asleep is the main symptom. This may lead to other symptoms, such as:  Feeling fatigued.  Feeling nervous about going to sleep.  Not feeling rested in the morning.  Having trouble concentrating.  Feeling  irritable, anxious, or depressed.  How is this treated? Treatment for insomnia depends on the cause. If your insomnia is caused by an underlying condition, treatment will focus on addressing the condition. Treatment may also include:  Medicines to help you sleep.  Counseling or therapy.  Lifestyle adjustments.  Follow these instructions at home:  Take medicines only as directed by your health care provider.  Keep regular sleeping and waking hours. Avoid naps.  Keep a sleep diary to help you and your health care provider figure out what could be causing your insomnia. Include: ? When you sleep. ? When you wake up during the night. ? How well you sleep. ? How rested you feel the next day. ? Any side effects of medicines you are taking. ? What you eat and drink.  Make your bedroom a comfortable place where it is easy to fall asleep: ? Put up shades or special blackout curtains to block light from outside. ? Use a white noise machine to block noise. ? Keep the temperature cool.  Exercise regularly as directed by your health care provider. Avoid exercising right before bedtime.  Use relaxation techniques to manage stress. Ask your health care provider to suggest some techniques that may work well for you. These may include: ? Breathing exercises. ? Routines to release muscle tension. ? Visualizing peaceful scenes.  Cut back on alcohol, caffeinated beverages, and cigarettes, especially close to bedtime. These can disrupt your sleep.  Do not overeat or eat spicy foods right before bedtime. This can lead to digestive discomfort that can make it hard for you to sleep.  Limit screen use before bedtime. This includes: ? Watching TV. ? Using your smartphone, tablet, and   computer.  Stick to a routine. This can help you fall asleep faster. Try to do a quiet activity, brush your teeth, and go to bed at the same time each night.  Get out of bed if you are still awake after 15 minutes  of trying to sleep. Keep the lights down, but try reading or doing a quiet activity. When you feel sleepy, go back to bed.  Make sure that you drive carefully. Avoid driving if you feel very sleepy.  Keep all follow-up appointments as directed by your health care provider. This is important. Contact a health care provider if:  You are tired throughout the day or have trouble in your daily routine due to sleepiness.  You continue to have sleep problems or your sleep problems get worse. Get help right away if:  You have serious thoughts about hurting yourself or someone else. This information is not intended to replace advice given to you by your health care provider. Make sure you discuss any questions you have with your health care provider. Document Released: 10/23/2000 Document Revised: 03/27/2016 Document Reviewed: 07/27/2014 Elsevier Interactive Patient Education  2018 [employer].  Back Exercises If you have pain in your back, do these exercises 2-3 times each day or as told by your doctor. When the pain goes away, do the exercises once each day, but repeat the steps more times for each exercise (do more repetitions). If you do not have pain in your back, do these exercises once each day or as told by your doctor. Exercises Single Knee to Chest  Do these steps 3-5 times in a row for each leg: 1. Lie on your back on a firm bed or the floor with your legs stretched out. 2. Bring one knee to your chest. 3. Hold your knee to your chest by grabbing your knee or thigh. 4. Pull on your knee until you feel a gentle stretch in your lower back. 5. Keep doing the stretch for 10-30 seconds. 6. Slowly let go of your leg and straighten it.  Pelvic Tilt  Do these steps 5-10 times in a row: 1. Lie on your back on a firm bed or the floor with your legs stretched out. 2. Bend your knees so they point up to the ceiling. Your feet should be flat on the floor. 3. Tighten your lower belly  (abdomen) muscles to press your lower back against the floor. This will make your tailbone point up to the ceiling instead of pointing down to your feet or the floor. 4. Stay in this position for 5-10 seconds while you gently tighten your muscles and breathe evenly.  Cat-Cow  Do these steps until your lower back bends more easily: 1. Get on your hands and knees on a firm surface. Keep your hands under your shoulders, and keep your knees under your hips. You may put padding under your knees. 2. Let your head hang down, and make your tailbone point down to the floor so your lower back is round like the back of a cat. 3. Stay in this position for 5 seconds. 4. Slowly lift your head and make your tailbone point up to the ceiling so your back hangs low (sags) like the back of a cow. 5. Stay in this position for 5 seconds.  Press-Ups  Do these steps 5-10 times in a row: 1. Lie on your belly (face-down) on the floor. 2. Place your hands near your head, about shoulder-width apart. 3. While you keep your  back relaxed and keep your hips on the floor, slowly straighten your arms to raise the top half of your body and lift your shoulders. Do not use your back muscles. To make yourself more comfortable, you may change where you place your hands. 4. Stay in this position for 5 seconds. 5. Slowly return to lying flat on the floor.  Bridges  Do these steps 10 times in a row: 1. Lie on your back on a firm surface. 2. Bend your knees so they point up to the ceiling. Your feet should be flat on the floor. 3. Tighten your butt muscles and lift your butt off of the floor until your waist is almost as high as your knees. If you do not feel the muscles working in your butt and the back of your thighs, slide your feet 1-2 inches farther away from your butt. 4. Stay in this position for 3-5 seconds. 5. Slowly lower your butt to the floor, and let your butt muscles relax.  If this exercise is too easy, try doing  it with your arms crossed over your chest. Belly Crunches  Do these steps 5-10 times in a row: 1. Lie on your back on a firm bed or the floor with your legs stretched out. 2. Bend your knees so they point up to the ceiling. Your feet should be flat on the floor. 3. Cross your arms over your chest. 4. Tip your chin a little bit toward your chest but do not bend your neck. 5. Tighten your belly muscles and slowly raise your chest just enough to lift your shoulder blades a tiny bit off of the floor. 6. Slowly lower your chest and your head to the floor.  Back Lifts Do these steps 5-10 times in a row: 1. Lie on your belly (face-down) with your arms at your sides, and rest your forehead on the floor. 2. Tighten the muscles in your legs and your butt. 3. Slowly lift your chest off of the floor while you keep your hips on the floor. Keep the back of your head in line with the curve in your back. Look at the floor while you do this. 4. Stay in this position for 3-5 seconds. 5. Slowly lower your chest and your face to the floor.  Contact a doctor if:  Your back pain gets a lot worse when you do an exercise.  Your back pain does not lessen 2 hours after you exercise. If you have any of these problems, stop doing the exercises. Do not do them again unless your doctor says it is okay. Get help right away if:  You have sudden, very bad back pain. If this happens, stop doing the exercises. Do not do them again unless your doctor says it is okay. This information is not intended to replace advice given to you by your health care provider. Make sure you discuss any questions you have with your health care provider. Document Released: 11/28/2010 Document Revised: 04/02/2016 Document Reviewed: 12/20/2014 Elsevier Interactive Patient Education  [employer].

## 2018-10-12 ENCOUNTER — Ambulatory Visit: Payer: BC Managed Care – PPO | Admitting: Advanced Practice Midwife

## 2018-10-24 ENCOUNTER — Ambulatory Visit
Admission: RE | Admit: 2018-10-24 | Discharge: 2018-10-24 | Disposition: A | Discharge status: Home / Self Care | Payer: BC Managed Care – PPO | Source: Ambulatory Visit | Attending: Family Medicine | Admitting: Family Medicine

## 2018-10-24 DIAGNOSIS — Z1231 Encounter for screening mammogram for malignant neoplasm of breast: Secondary | ICD-10-CM | POA: Insufficient documentation

## 2018-10-27 ENCOUNTER — Ambulatory Visit: Payer: BC Managed Care – PPO | Admitting: Advanced Practice Midwife

## 2018-11-16 ENCOUNTER — Ambulatory Visit (INDEPENDENT_AMBULATORY_CARE_PROVIDER_SITE_OTHER): Payer: BC Managed Care – PPO | Admitting: Advanced Practice Midwife

## 2018-11-16 ENCOUNTER — Other Ambulatory Visit: Payer: Self-pay

## 2018-11-16 ENCOUNTER — Encounter: Payer: Self-pay | Admitting: Advanced Practice Midwife

## 2018-11-16 VITALS — BP 130/74 | HR 77 | Ht 65.0 in | Wt 194.0 lb

## 2018-11-16 DIAGNOSIS — Z30432 Encounter for removal of intrauterine contraceptive device: Secondary | ICD-10-CM | POA: Diagnosis not present

## 2018-11-16 DIAGNOSIS — Z01419 Encounter for gynecological examination (general) (routine) without abnormal findings: Secondary | ICD-10-CM | POA: Diagnosis not present

## 2018-11-16 DIAGNOSIS — Z79890 Hormone replacement therapy (postmenopausal): Secondary | ICD-10-CM

## 2018-11-16 MED ORDER — HRT CREAM BASE CREA: 0.5000 mL | TOPICAL_CREAM | 11 refills | Status: DC

## 2018-11-16 NOTE — Patient Instructions (Signed)

## 2018-11-16 NOTE — Progress Notes (Signed)
Patient ID: Melissa Aguilar, female   DOB: Apr 07, 1956, 63 y.o.   MRN: [id number]      Gynecology Annual Exam  PCP: Mikey Kirschner, MD  Chief Complaint:  Chief Complaint  Patient presents with  . Gynecologic Exam    No complaints    History of Present Illness:Patient is a 63 y.o. G2P0011 presents for annual exam. The patient has no gyn complaints today. She requests refill of HRT cream. Patient's IUD is past 5 year lifespan but she hesitates to have it removed due to possible benefits she may still get from it. She is concerned that she could possibly get pregnant and also worries about side effects she might have from having it removed. Discussion of unlikely fertility at age 72 and the amount of hormones in the IUD at this point is minimal. She agrees to have the IUD removed.   LMP: No LMP recorded. Patient is postmenopausal. Menarche:not applicable Postcoital Bleeding: no Dysmenorrhea: not applicable  The patient is sexually active. She denies dyspareunia.  The patient does perform self breast exams.  There is no notable family history of breast or ovarian cancer in her family.  The patient wears seatbelts: yes.   The patient has regular exercise: She has some physical activity including water aerobics only occasionally but she plans to increase the frequency. She admits healthy diet and adequate hydration.    The patient denies current symptoms of depression.     Review of Systems: Review of Systems  Constitutional: Negative.   HENT: Negative.   Eyes: Negative.   Respiratory: Negative.   Cardiovascular: Negative.   Gastrointestinal: Negative.   Genitourinary: Negative.   Musculoskeletal: Negative.   Skin: Negative.   Neurological: Negative.   Endo/Heme/Allergies: Negative.   Psychiatric/Behavioral: Negative.     Past Medical History:  Past Medical History:  Diagnosis Date  . Colon polyps   . IBS (irritable bowel syndrome)   . Leiomyoma of uterus   .  Menorrhagia     Past Surgical History:  Past Surgical History:  Procedure Laterality Date  . BREAST BIOPSY Left 10/1995   neg core  . NO PAST SURGERIES      Gynecologic History:  No LMP recorded. Patient is postmenopausal. Last Pap: 2018 Results were:  no abnormalities  Last mammogram: 1 month ago Results were: BI-RAD I  Obstetric History: G61P0011  Family History:  Family History  Problem Relation Age of Onset  . Heart disease Father   . Hypertension Father   . Breast cancer Maternal Aunt 80  . Multiple myeloma Mother 96    Social History:  Social History   Socioeconomic History  . Marital status: Married    Spouse name: Not on file  . Number of children: Not on file  . Years of education: Not on file  . Highest education level: Not on file  Occupational History  . Not on file  Social Needs  . Financial resource strain: Not on file  . Food insecurity:    Worry: Not on file    Inability: Not on file  . Transportation needs:    Medical: Not on file    Non-medical: Not on file  Tobacco Use  . Smoking status: Never Smoker  . Smokeless tobacco: Never Used  Substance and Sexual Activity  . Alcohol use: Yes    Comment: occasional  . Drug use: No  . Sexual activity: Yes    Birth control/protection: Post-menopausal  Lifestyle  . Physical activity:  Days per week: 7 days    Minutes per session: 60 min  . Stress: Only a little  Relationships  . Social connections:    Talks on phone: Three times a week    Gets together: Once a week    Attends religious service: More than 4 times per year    Active member of club or organization: No    Attends meetings of clubs or organizations: Never    Relationship status: Married  . Intimate partner violence:    Fear of current or ex partner: No    Emotionally abused: No    Physically abused: No    Forced sexual activity: No  Other Topics Concern  . Not on file  Social History Narrative  . Not on file     Allergies:  Allergies  Allergen Reactions  . Amoxil [Amoxicillin]     swelling  . Augmentin [Amoxicillin-Pot Clavulanate]     swelling  . Paxil [Paroxetine Hcl]     confusion  . Tamiflu [Oseltamivir Phosphate]     Medications: Prior to Admission medications   Medication Sig Start Date End Date Taking? Authorizing Provider  Ascorbic Acid (VITAMIN C) 500 MG CHEW Chew by mouth.   Yes [provider]  Cholecalciferol (VITAMIN D3) 50 MCG (2000 UT) capsule Take by mouth.   Yes [provider]  diazepam (VALIUM) 5 MG tablet Take one po qd prn anxiety 11/10/17  Yes Mikey Kirschner, MD  dicyclomine (BENTYL) 20 MG tablet Take 20 mg by mouth 4 (four) times daily.   Yes [provider]  Hormone Cream Base (HRT CREAM BASE) CREA Apply 0.5 mLs topically as directed. Progesterone 3% HRT cream 11/16/18  Yes Rod Can, CNM  vitamin B-12 (CYANOCOBALAMIN) 1000 MCG tablet Take by mouth.   Yes [provider]  zolpidem (AMBIEN) 5 MG tablet Take 1 tablet (5 mg total) by mouth at bedtime as needed. for sleep 07/15/18  Yes Kathyrn Drown, MD    Physical Exam Vitals: Blood pressure 130/74, pulse 77, height '5\' 5"'  (1.651 m), weight 194 lb (88 kg).  General: NAD HEENT: normocephalic, anicteric Thyroid: no enlargement, no palpable nodules Pulmonary: No increased work of breathing, CTAB Cardiovascular: RRR, distal pulses 2+ Breast: Breast symmetrical, no tenderness, no palpable nodules or masses, no skin or nipple retraction present, no nipple discharge.  No axillary or supraclavicular lymphadenopathy. Abdomen: NABS, soft, non-tender, non-distended.  Umbilicus without lesions.  No hepatomegaly, splenomegaly or masses palpable. No evidence of hernia  Genitourinary:  External: Normal external female genitalia.  Normal urethral meatus, normal Bartholin's and Skene's glands.    Vagina: Normal vaginal mucosa, no evidence of prolapse.    Cervix: Grossly normal in  appearance, no bleeding, no CMT, IUD strings visualized, grasped with curved forceps and removed easily  Uterus: deferred for no concerns   Adnexa: deferred for no concerns  Rectal: deferred  Lymphatic: no evidence of inguinal lymphadenopathy Extremities: no edema, erythema, or tenderness Neurologic: Grossly intact Psychiatric: mood appropriate, affect full    Assessment: 63 y.o. G2P0011 routine annual exam  Plan: Problem List Items Addressed This Visit    None    Visit Diagnoses    Well woman exam with routine gynecological exam    -  Primary   On postmenopausal hormone replacement therapy       Relevant Medications   Hormone Cream Base (HRT CREAM BASE) CREA      1) Mammogram - recommend yearly screening mammogram.  Mammogram up to  date and managed by PCP  2) STI screening  wasoffered and declined  3) ASCCP guidelines and rational discussed.  Patient opts for every 3 years screening interval  4) Osteoporosis  - per USPTF routine screening DEXA at age 1 Patient prefers to wait until age 51 for screening  Consider FDA-approved medical therapies in postmenopausal women and men aged 40 years and older, based on the following: a) A hip or vertebral (clinical or morphometric) fracture b) T-score ? -2.5 at the femoral neck or spine after appropriate evaluation to exclude secondary causes C) Low bone mass (T-score between -1.0 and -2.5 at the femoral neck or spine) and a 10-year probability of a hip fracture ? 3% or a 10-year probability of a major osteoporosis-related fracture ? 20% based on the [place]-adapted WHO algorithm   5) Routine healthcare maintenance including cholesterol, diabetes screening discussed managed by PCP  6) Colonoscopy managed by PCP.  Screening recommended starting at age 63 for average risk individuals, age 13 for individuals deemed at increased risk (including African Americans) and recommended to continue until age 29.  For patient age 37-85 individualized  approach is recommended.  Gold standard screening is via colonoscopy, Cologuard screening is an acceptable alternative for patient unwilling or unable to undergo colonoscopy.  "Colorectal cancer screening for average?risk adults: 2018 guideline update from the American Cancer Society"CA: A Cancer Journal for Clinicians: Apr 07, 2017   7) Return in 1 year (on 11/17/2019) for annual established gyn.    Rod Can, [facility] 11/16/2018, 3:29 PM       GYNECOLOGY OFFICE PROCEDURE NOTE  Sway Guttierrez Rotenberry is a 63 y.o. G2P0011 here for IUD removal. The patient currently has Mirena IUD placed in March of 2014.  No GYN concerns.  Last pap smear was November 2018 and was normal.  IUD Removal  Patient identified, informed consent performed, consent signed.   Discussed risks of irregular bleeding, cramping, infection, malpositioning or uterine perforation of the IUD which may require further procedures. Time out was performed. Speculum placed in the vagina. The strings of the IUD were grasped and pulled using curved forceps. The IUD was successfully removed in its entirety.  Patient tolerated procedure well.   Patient was given post-procedure instructions.    Rod Can, CNM [facility], [facility]  IUD insertion CPT [phone],  [facility] [place] IUD remval [id number] Modifer 25, plus Modifer 79 is done during a global billing visit

## 2018-11-21 ENCOUNTER — Telehealth: Payer: Self-pay

## 2018-11-21 NOTE — Telephone Encounter (Signed)
Pt states Melissa Aguilar removed her IUD last Wednesday, today she has started bleeding, is this okay just to monitor or because of her age should she be seen?

## 2018-11-21 NOTE — Telephone Encounter (Signed)
Pt aware.

## 2018-11-21 NOTE — Telephone Encounter (Signed)
IUD age 63?  Yes, normal to have withdrawal bleeding after IUD removal.  Monitor for one week.  If persists, then appt w me

## 2018-11-21 NOTE — Telephone Encounter (Signed)
Pt has question from last appt.  [phone].

## 2018-12-02 ENCOUNTER — Telehealth: Payer: Self-pay | Admitting: Family Medicine

## 2018-12-02 MED ORDER — TRIAMCINOLONE ACETONIDE 0.1 % EX CREA: 1.0000 "application " | TOPICAL_CREAM | Freq: Two times a day (BID) | CUTANEOUS | 0 refills | Status: DC

## 2018-12-02 NOTE — Telephone Encounter (Signed)
Pt was wearing a turtle neck collar, pt states there were fibers from collar that caused pt to itch and pt's neck remains itching, pt requesting steroid cream. Advise.   [place], [place].

## 2018-12-02 NOTE — Telephone Encounter (Signed)
Triamcinolone 0.1 per cent 30 g bid to affected area

## 2018-12-02 NOTE — Telephone Encounter (Signed)
Prescription sent electronically to pharmacy. Patient notified. 

## 2018-12-05 ENCOUNTER — Telehealth: Payer: Self-pay | Admitting: Family Medicine

## 2018-12-05 NOTE — Telephone Encounter (Signed)
FYI- patient just wanted to say thank you for the cream you prescribe for her rash it really helped.

## 2018-12-05 NOTE — Telephone Encounter (Signed)
great

## 2019-01-12 ENCOUNTER — Ambulatory Visit: Payer: BC Managed Care – PPO | Admitting: Family Medicine

## 2019-01-12 ENCOUNTER — Encounter: Payer: Self-pay | Admitting: Family Medicine

## 2019-01-12 VITALS — Temp 98.5°F | Wt 195.8 lb

## 2019-01-12 DIAGNOSIS — J329 Chronic sinusitis, unspecified: Secondary | ICD-10-CM | POA: Diagnosis not present

## 2019-01-12 DIAGNOSIS — J029 Acute pharyngitis, unspecified: Secondary | ICD-10-CM | POA: Diagnosis not present

## 2019-01-12 LAB — POCT RAPID STREP A (OFFICE): Rapid Strep A Screen: NEGATIVE

## 2019-01-12 MED ORDER — HYDROCODONE-HOMATROPINE 5-1.5 MG/5ML PO SYRP: ORAL_SOLUTION | ORAL | 0 refills | Status: DC

## 2019-01-12 MED ORDER — CLARITHROMYCIN 500 MG PO TABS: ORAL_TABLET | ORAL | 0 refills | Status: DC

## 2019-01-12 NOTE — Progress Notes (Signed)
   Subjective:    Patient ID: Melissa Aguilar, female    DOB: 1956-10-07, 63 y.o.   MRN: [id number]  Cough  Associated symptoms include headaches, nasal congestion, rhinorrhea and a sore throat. Associated symptoms comments: Chest congestion, body aches, cant sleep. Treatments tried: Tylenol, Vicks, Nyquil, Alka Seltzer. The treatment provided mild relief.   Pt states she would like a strep test performed  Results for orders placed or performed in visit on 01/12/19  POCT rapid strep A  Result Value Ref Range   Rapid Strep A Screen Negative Negative    Last week had some exposures  Cough is prdctive  None this morne  98 . 6   Fever off and on, getting hot and getting cold    Clear disch  si   Hard bad cough internite      head hurting  Review of Systems  HENT: Positive for rhinorrhea and sore throat.   Respiratory: Positive for cough.   Neurological: Positive for headaches.       Objective:   Physical Exam  Alert, mild malaise. Hydration good Vitals stable. frontal/ maxillary tenderness evident positive nasal congestion. pharynx normal neck supple  lungs clear/no crackles or wheezes. heart regular in rhythm       Assessment & Plan:  Impression rhinosinusitis likely post viral, discussed with patient. plan antibiotics prescribed. Questions answered. Symptomatic care discussed. warning signs discussed. WSL

## 2019-01-18 ENCOUNTER — Telehealth: Payer: Self-pay | Admitting: Family Medicine

## 2019-01-18 MED ORDER — DOXYCYCLINE HYCLATE 100 MG PO TABS: ORAL_TABLET | ORAL | 0 refills | Status: DC

## 2019-01-18 MED ORDER — HYDROCODONE-HOMATROPINE 5-1.5 MG/5ML PO SYRP: ORAL_SOLUTION | ORAL | 0 refills | Status: DC

## 2019-01-18 NOTE — Telephone Encounter (Signed)
Pt had hycodan prescribed at visit. No fever. Has been taking Tylenol. Pt states she is still taking antibiotic but she is not feeling well. Pt states she does not think her symptoms have gotten any better. Pt states she did use all the hycodan cough syrup and that did help. Please advise. Thank you

## 2019-01-18 NOTE — Telephone Encounter (Signed)
Stop biaxin, start doxy 100 bid ten d, may give hycodan 3 oz one twpn q 8 hrs prn cough, I think always a good idea to stay away from the older folks when experiencing resp symtoms if posssible

## 2019-01-18 NOTE — Telephone Encounter (Signed)
Pt contacted and verbalized understanding. Cough syrup pending and doxy sent to pharmacy.

## 2019-01-18 NOTE — Telephone Encounter (Signed)
Patient was seen on 3/5 and wanting something called in for bad cough she is requesting hydrocodone cough syrup. She states has bronchitis and on antibiotic and wanting to know is it safe for her to be around her 64 yr old mother. [facility]

## 2019-01-19 ENCOUNTER — Telehealth: Payer: Self-pay | Admitting: Family Medicine

## 2019-01-19 ENCOUNTER — Other Ambulatory Visit: Payer: Self-pay | Admitting: Family Medicine

## 2019-01-19 MED ORDER — HYDROCODONE-HOMATROPINE 5-1.5 MG/5ML PO SYRP: ORAL_SOLUTION | ORAL | 0 refills | Status: DC

## 2019-01-19 NOTE — Telephone Encounter (Signed)
Pt.notified

## 2019-01-19 NOTE — Telephone Encounter (Signed)
Medicine sent as directed

## 2019-01-19 NOTE — Telephone Encounter (Signed)
Was sent to [place] yesterday but is on back order and not avaiable at [place] would like sent in to [facility] [place]

## 2019-01-19 NOTE — Telephone Encounter (Signed)
Requesting refill of HYDROcodone-homatropine (HYCODAN) 5-1.5 MG/5ML syrup to be sent to another pharmacy due to not having in stock at [place].   [facility] [address] [place] [address]

## 2019-02-19 ENCOUNTER — Other Ambulatory Visit: Payer: Self-pay | Admitting: Family Medicine

## 2019-02-20 NOTE — Telephone Encounter (Signed)
Six mo ok 

## 2019-04-25 ENCOUNTER — Telehealth: Payer: Self-pay | Admitting: Family Medicine

## 2019-04-25 NOTE — Telephone Encounter (Signed)
Wants refill on Valium.    [facility]

## 2019-04-25 NOTE — Telephone Encounter (Signed)
Last seen for anxiety on jan 2019

## 2019-04-26 ENCOUNTER — Other Ambulatory Visit: Payer: Self-pay | Admitting: *Deleted

## 2019-04-26 MED ORDER — DIAZEPAM 5 MG PO TABS: ORAL_TABLET | ORAL | 0 refills | Status: DC

## 2019-04-26 NOTE — Telephone Encounter (Signed)
Ref times one

## 2019-04-26 NOTE — Telephone Encounter (Signed)
rx faxed

## 2019-04-26 NOTE — Telephone Encounter (Signed)
Script printed. Await signature then will fax and call pt

## 2019-04-26 NOTE — Telephone Encounter (Signed)
Pt.notified

## 2019-05-08 ENCOUNTER — Telehealth: Payer: Self-pay | Admitting: Family Medicine

## 2019-05-08 NOTE — Telephone Encounter (Signed)
Patient stating that [facility] never received refill for her Valium that was sent 04/26/19. Wants to change pharmacy to [facility] in [place].

## 2019-05-08 NOTE — Telephone Encounter (Signed)
Please advise. Thank you

## 2019-05-08 NOTE — Telephone Encounter (Signed)
Let's do 

## 2019-05-09 MED ORDER — DIAZEPAM 5 MG PO TABS: ORAL_TABLET | ORAL | 0 refills | Status: DC

## 2019-05-09 NOTE — Telephone Encounter (Signed)
Script printed, awaiting signature. Pt is aware we will fax to [facility] in [place] once signed. Pt verbalized understanding.

## 2019-07-20 ENCOUNTER — Telehealth: Payer: Self-pay | Admitting: Family Medicine

## 2019-07-20 MED ORDER — PREDNISONE 20 MG PO TABS: ORAL_TABLET | ORAL | 0 refills | Status: DC

## 2019-07-20 NOTE — Telephone Encounter (Signed)
Adult pred taper 

## 2019-07-20 NOTE — Telephone Encounter (Signed)
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

## 2019-07-20 NOTE — Telephone Encounter (Signed)
Patient notified and verbalized understanding. 

## 2019-07-20 NOTE — Addendum Note (Signed)
Addended by: Dairl Ponder on: 07/20/2019 01:42 PM   Modules accepted: Orders

## 2019-07-20 NOTE — Telephone Encounter (Signed)
Prescription sent electronically to pharmacy. Left message to return call to notify patient. 

## 2019-09-14 ENCOUNTER — Other Ambulatory Visit: Payer: Self-pay | Admitting: Family Medicine

## 2019-09-15 ENCOUNTER — Telehealth: Payer: Self-pay | Admitting: Family Medicine

## 2019-09-15 MED ORDER — ZOLPIDEM TARTRATE 5 MG PO TABS: 5.0000 mg | ORAL_TABLET | Freq: Every evening | ORAL | 5 refills | Status: DC | PRN

## 2019-09-15 NOTE — Telephone Encounter (Signed)
Refills sent and pt notified.  

## 2019-09-15 NOTE — Telephone Encounter (Signed)
Patient is requesting refill on zolpidem 5mg  called into [facility]. She was last seen 01/12/19 for sinus infection.

## 2019-09-15 NOTE — Telephone Encounter (Signed)
Please advise. Thank you

## 2019-09-15 NOTE — Telephone Encounter (Signed)
Sure 6 mo worth

## 2019-09-18 ENCOUNTER — Telehealth: Payer: Self-pay | Admitting: Family Medicine

## 2019-09-18 ENCOUNTER — Other Ambulatory Visit: Payer: Self-pay | Admitting: *Deleted

## 2019-09-18 MED ORDER — ZOLPIDEM TARTRATE 5 MG PO TABS: 5.0000 mg | ORAL_TABLET | Freq: Every evening | ORAL | 5 refills | Status: DC | PRN

## 2019-09-18 NOTE — Telephone Encounter (Signed)
Pt is calling stating her zolpidem (AMBIEN) 5 MG tablet was sent to the wrong pharmacy. Medication was sent to [facility] in [place] but she wants them sent to [facility] in [place].

## 2019-09-18 NOTE — Telephone Encounter (Signed)
Called [facility] in [place] and canceled rx and and called it in to [facility]. Pt notified

## 2019-09-26 ENCOUNTER — Other Ambulatory Visit: Payer: Self-pay | Admitting: Family Medicine

## 2019-09-26 DIAGNOSIS — Z1231 Encounter for screening mammogram for malignant neoplasm of breast: Secondary | ICD-10-CM

## 2019-10-26 ENCOUNTER — Ambulatory Visit
Admission: RE | Admit: 2019-10-26 | Discharge: 2019-10-26 | Disposition: A | Discharge status: Home / Self Care | Payer: BC Managed Care – PPO | Source: Ambulatory Visit | Attending: Family Medicine | Admitting: Family Medicine

## 2019-10-26 DIAGNOSIS — Z1231 Encounter for screening mammogram for malignant neoplasm of breast: Secondary | ICD-10-CM | POA: Diagnosis present

## 2019-11-15 ENCOUNTER — Telehealth: Payer: Self-pay | Admitting: Family Medicine

## 2019-11-15 MED ORDER — ZOLPIDEM TARTRATE 5 MG PO TABS: 5.0000 mg | ORAL_TABLET | Freq: Every evening | ORAL | 5 refills | Status: DC | PRN

## 2019-11-15 NOTE — Telephone Encounter (Signed)
Six mo refills--

## 2019-11-15 NOTE — Telephone Encounter (Signed)
Refills faxed to pharmacy. Pt returned call and verbalized understanding

## 2019-11-15 NOTE — Telephone Encounter (Signed)
15 tablet with 5 refills called in back in November; contacted pharmacy to see if she had refills left; pharmacy states no refills left. Attempted to contact patient, left message to return call.  Please advise. Thank you.

## 2019-11-15 NOTE — Telephone Encounter (Signed)
Patient is requesting refill on zolpidem 5 mg to be called into [facility]

## 2019-11-15 NOTE — Telephone Encounter (Signed)
Medication printed out; awaiting signature. Left message to return call

## 2019-11-29 ENCOUNTER — Encounter: Payer: Self-pay | Admitting: Advanced Practice Midwife

## 2019-11-29 ENCOUNTER — Telehealth: Payer: Self-pay

## 2019-11-29 ENCOUNTER — Other Ambulatory Visit: Payer: Self-pay

## 2019-11-29 ENCOUNTER — Ambulatory Visit (INDEPENDENT_AMBULATORY_CARE_PROVIDER_SITE_OTHER): Payer: BC Managed Care – PPO | Admitting: Advanced Practice Midwife

## 2019-11-29 ENCOUNTER — Other Ambulatory Visit (HOSPITAL_COMMUNITY)
Admission: RE | Admit: 2019-11-29 | Discharge: 2019-11-29 | Disposition: A | Discharge status: Home / Self Care | Payer: BC Managed Care – PPO | Source: Ambulatory Visit | Attending: Advanced Practice Midwife | Admitting: Advanced Practice Midwife

## 2019-11-29 VITALS — BP 130/84 | Ht 65.0 in | Wt 194.0 lb

## 2019-11-29 DIAGNOSIS — Z79890 Hormone replacement therapy (postmenopausal): Secondary | ICD-10-CM

## 2019-11-29 DIAGNOSIS — Z1211 Encounter for screening for malignant neoplasm of colon: Secondary | ICD-10-CM

## 2019-11-29 DIAGNOSIS — Z124 Encounter for screening for malignant neoplasm of cervix: Secondary | ICD-10-CM

## 2019-11-29 DIAGNOSIS — Z01419 Encounter for gynecological examination (general) (routine) without abnormal findings: Secondary | ICD-10-CM | POA: Diagnosis not present

## 2019-11-29 MED ORDER — HRT CREAM BASE CREA: 0.5000 mL | TOPICAL_CREAM | 11 refills | Status: DC

## 2019-11-29 NOTE — Telephone Encounter (Signed)
Gastroenterology Pre-Procedure Review  Request Date: Friday 01/05/20 Requesting Physician: Dr. Vicente Males  PATIENT REVIEW QUESTIONS: The patient responded to the following health history questions as indicated:    1. Are you having any GI issues? no 2. Do you have a personal history of Polyps? yes (about 5 years ago) 3. Do you have a family history of Colon Cancer or Polyps? yes (brother polyps) 4. Diabetes Mellitus? no 5. Joint replacements in the past 12 months?no 6. Major health problems in the past 3 months?no 7. Any artificial heart valves, MVP, or defibrillator?no    MEDICATIONS & ALLERGIES:    Patient reports the following regarding taking any anticoagulation/antiplatelet therapy:   Plavix, Coumadin, Eliquis, Xarelto, Lovenox, Pradaxa, Brilinta, or Effient? no Aspirin? no  Patient confirms/reports the following medications:  Current Outpatient Medications  Medication Sig Dispense Refill  . Ascorbic Acid (VITAMIN C) 500 MG CHEW Chew by mouth.    . Cholecalciferol (VITAMIN D3) 50 MCG (2000 UT) capsule Take by mouth.    . diazepam (VALIUM) 5 MG tablet Take one po qd prn anxiety 30 tablet 0  . dicyclomine (BENTYL) 20 MG tablet Take 20 mg by mouth 4 (four) times daily.    Marland Kitchen doxycycline (VIBRA-TABS) 100 MG tablet Take one tablet by mouth twice daily for 10 days 20 tablet 0  . Hormone Cream Base (HRT CREAM BASE) CREA Apply 0.5 mLs topically as directed. Progesterone 3% HRT cream 500 g 11  . HYDROcodone-homatropine (HYCODAN) 5-1.5 MG/5ML syrup Take one tsp by mouth every 8 hours as needed for cough 90 mL 0  . predniSONE (DELTASONE) 20 MG tablet 3qd for 3d then 2qd for 3d then 1qd for 3d 18 tablet 0  . triamcinolone cream (KENALOG) 0.1 % Apply 1 application topically 2 (two) times daily. Affected area 30 g 0  . vitamin B-12 (CYANOCOBALAMIN) 1000 MCG tablet Take by mouth.    . zolpidem (AMBIEN) 5 MG tablet Take 1 tablet (5 mg total) by mouth at bedtime as needed. for sleep 15 tablet 5   No  current facility-administered medications for this visit.    Patient confirms/reports the following allergies:  Allergies  Allergen Reactions  . Amoxil [Amoxicillin]     swelling  . Augmentin [Amoxicillin-Pot Clavulanate]     swelling  . Paxil [Paroxetine Hcl]     confusion  . Tamiflu [Oseltamivir Phosphate]     No orders of the defined types were placed in this encounter.   AUTHORIZATION INFORMATION Primary Insurance: 1D#: Group #:  Secondary Insurance: 1D#: Group #:  SCHEDULE INFORMATION: Date: 01/05/20 Time: Location:[facility]

## 2019-11-29 NOTE — Progress Notes (Signed)
Gynecology Annual Exam  PCP: Mikey Kirschner, MD  Chief Complaint:  Chief Complaint  Patient presents with  . Annual Exam    History of Present Illness:Patient is a 64 y.o. G2P0011 presents for annual exam. The patient has no gyn complaints today. She requests refill of HRT cream. She has not had any bleeding since removal of IUD at last annual visit.  LMP: No LMP recorded. Patient is postmenopausal.  Postcoital Bleeding: no Dysmenorrhea: not applicable  The patient is sexually active. She denies dyspareunia.  The patient does perform self breast exams.  There is no notable family history of breast or ovarian cancer in her family.  The patient wears seatbelts: yes.   The patient has regular exercise: she walks regularly. She admits healthy diet, adequate hydration and adequate sleep.    The patient denies current symptoms of depression.     Review of Systems: Review of Systems  Constitutional: Negative.   HENT: Negative.   Eyes: Negative.   Respiratory: Negative.   Cardiovascular: Negative.   Gastrointestinal: Negative.   Genitourinary: Negative.   Musculoskeletal: Negative.   Skin: Negative.   Neurological: Negative.   Endo/Heme/Allergies: Negative.   Psychiatric/Behavioral: Negative.     Past Medical History:  Past Medical History:  Diagnosis Date  . Colon polyps   . IBS (irritable bowel syndrome)   . Leiomyoma of uterus   . Menorrhagia     Past Surgical History:  Past Surgical History:  Procedure Laterality Date  . BREAST BIOPSY Left 10/1995   neg core  . NO PAST SURGERIES      Gynecologic History:  No LMP recorded. Patient is postmenopausal. Last Pap: 2 years ago Results were:  no abnormalities  Last mammogram: 1 month ago Results were: BI-RAD I  Obstetric History: G12P0011  Family History:  Family History  Problem Relation Age of Onset  . Heart disease Father   . Hypertension Father   . Breast cancer Maternal Aunt 80       great aunt   . Multiple myeloma Mother 65    Social History:  Social History   Socioeconomic History  . Marital status: Married    Spouse name: Not on file  . Number of children: Not on file  . Years of education: Not on file  . Highest education level: Not on file  Occupational History  . Not on file  Tobacco Use  . Smoking status: Never Smoker  . Smokeless tobacco: Never Used  Substance and Sexual Activity  . Alcohol use: Yes    Comment: occasional  . Drug use: No  . Sexual activity: Yes    Birth control/protection: Post-menopausal  Other Topics Concern  . Not on file  Social History Narrative  . Not on file   Social Determinants of Health   Financial Resource Strain:   . Difficulty of Paying Living Expenses: Not on file  Food Insecurity:   . Worried About Charity fundraiser in the Last Year: Not on file  . Ran Out of Food in the Last Year: Not on file  Transportation Needs:   . Lack of Transportation (Medical): Not on file  . Lack of Transportation (Non-Medical): Not on file  Physical Activity:   . Days of Exercise per Week: Not on file  . Minutes of Exercise per Session: Not on file  Stress:   . Feeling of Stress : Not on file  Social Connections:   . Frequency of Communication with Friends  and Family: Not on file  . Frequency of Social Gatherings with Friends and Family: Not on file  . Attends Religious Services: Not on file  . Active Member of Clubs or Organizations: Not on file  . Attends Archivist Meetings: Not on file  . Marital Status: Not on file  Intimate Partner Violence:   . Fear of Current or Ex-Partner: Not on file  . Emotionally Abused: Not on file  . Physically Abused: Not on file  . Sexually Abused: Not on file    Allergies:  Allergies  Allergen Reactions  . Amoxil [Amoxicillin]     swelling  . Augmentin [Amoxicillin-Pot Clavulanate]     swelling  . Paxil [Paroxetine Hcl]     confusion  . Tamiflu [Oseltamivir Phosphate]      Medications: Prior to Admission medications   Medication Sig Start Date End Date Taking? Authorizing Provider  Ascorbic Acid (VITAMIN C) 500 MG CHEW Chew by mouth.   Yes [provider]  Cholecalciferol (VITAMIN D3) 50 MCG (2000 UT) capsule Take by mouth.   Yes [provider]  diazepam (VALIUM) 5 MG tablet Take one po qd prn anxiety 05/09/19  Yes Mikey Kirschner, MD  dicyclomine (BENTYL) 20 MG tablet Take 20 mg by mouth 4 (four) times daily.   Yes [provider]  doxycycline (VIBRA-TABS) 100 MG tablet Take one tablet by mouth twice daily for 10 days 01/18/19  Yes Mikey Kirschner, MD  Hormone Cream Base (HRT CREAM BASE) CREA Apply 0.5 mLs topically as directed. Progesterone 3% HRT cream 11/29/19  Yes Rod Can, CNM  HYDROcodone-homatropine [facility]) 5-1.5 MG/5ML syrup Take one tsp by mouth every 8 hours as needed for cough 01/19/19  Yes Luking, Elayne Snare, MD  predniSONE (DELTASONE) 20 MG tablet 3qd for 3d then 2qd for 3d then 1qd for 3d 07/20/19  Yes Mikey Kirschner, MD  triamcinolone cream (KENALOG) 0.1 % Apply 1 application topically 2 (two) times daily. Affected area 12/02/18  Yes Mikey Kirschner, MD  vitamin B-12 (CYANOCOBALAMIN) 1000 MCG tablet Take by mouth.   Yes [provider]  zolpidem (AMBIEN) 5 MG tablet Take 1 tablet (5 mg total) by mouth at bedtime as needed. for sleep 11/15/19  Yes Mikey Kirschner, MD    Physical Exam Vitals: Blood pressure 130/84, height '5\' 5"'  (1.651 m), weight 194 lb (88 kg).  General: NAD HEENT: normocephalic, anicteric Thyroid: no enlargement, no palpable nodules Pulmonary: No increased work of breathing, CTAB Cardiovascular: RRR, distal pulses 2+ Breast: Breast symmetrical, no tenderness, no palpable nodules or masses, no skin or nipple retraction present, no nipple discharge.  No axillary or supraclavicular lymphadenopathy. Abdomen: NABS, soft, non-tender, non-distended.  Umbilicus without lesions.  No  hepatomegaly, splenomegaly or masses palpable. No evidence of hernia  Genitourinary:  External: Normal external female genitalia.  Normal urethral meatus, normal Bartholin's and Skene's glands.    Vagina: Normal vaginal mucosa, no evidence of prolapse.    Cervix: Grossly normal in appearance, no bleeding, no CMT  Uterus: Non-enlarged, mobile, normal contour.    Adnexa: ovaries non-enlarged, no adnexal masses  Rectal: deferred  Lymphatic: no evidence of inguinal lymphadenopathy Extremities: no edema, erythema, or tenderness Neurologic: Grossly intact Psychiatric: mood appropriate, affect full     Assessment: 64 y.o. G2P0011 routine annual exam  Plan: Problem List Items Addressed This Visit    None    Visit Diagnoses    Well woman exam with routine gynecological exam    -  Primary   Relevant Orders   Cytology - PAP   Cervical cancer screening       Relevant Orders   Cytology - PAP   Screen for colon cancer       Relevant Orders   Ambulatory referral to Gastroenterology   On postmenopausal hormone replacement therapy       Relevant Medications   Hormone Cream Base (HRT CREAM BASE) CREA      1) Mammogram - recommend yearly screening mammogram.  Mammogram Is up to date  2) STI screening  was offered and declined  3) ASCCP guidelines and rationale discussed.  Patient opts for every 3 years screening interval  4) Osteoporosis  - per USPTF routine screening DEXA at age 34   Consider FDA-approved medical therapies in postmenopausal women and men aged 37 years and older, based on the following: a) A hip or vertebral (clinical or morphometric) fracture b) T-score ? -2.5 at the femoral neck or spine after appropriate evaluation to exclude secondary causes C) Low bone mass (T-score between -1.0 and -2.5 at the femoral neck or spine) and a 10-year probability of a hip fracture ? 3% or a 10-year probability of a major osteoporosis-related fracture ? 20% based on the [place]-adapted WHO  algorithm   5) Routine healthcare maintenance including cholesterol, diabetes screening discussed managed by PCP  6) Colonoscopy: She has tried to schedule through [facility] and has not heard from anyone. She requests referral be sent again.  Screening recommended starting at age 25 for average risk individuals, age 6 for individuals deemed at increased risk (including African Americans) and recommended to continue until age 70.  For patient age 3-85 individualized approach is recommended.  Gold standard screening is via colonoscopy, Cologuard screening is an acceptable alternative for patient unwilling or unable to undergo colonoscopy.  "Colorectal cancer screening for average?risk adults: 2018 guideline update from the American Cancer Society"CA: A Cancer Journal for Clinicians: Apr 07, 2017   7) Return in about 1 year (around 11/28/2020) for annual established gyn.   Rod Can, CNM [facility] 11/29/2019, 1:28 PM

## 2019-12-04 LAB — CYTOLOGY - PAP
Comment: NEGATIVE
Diagnosis: NEGATIVE
High risk HPV: NEGATIVE

## 2019-12-14 ENCOUNTER — Encounter: Payer: Self-pay | Admitting: Family Medicine

## 2019-12-26 ENCOUNTER — Other Ambulatory Visit: Payer: Self-pay | Admitting: Family Medicine

## 2019-12-26 NOTE — Telephone Encounter (Signed)
One ref, call pt and sched chronic follow up

## 2019-12-27 NOTE — Telephone Encounter (Signed)
Patient scheduled medication follow up for 3/3.

## 2019-12-27 NOTE — Telephone Encounter (Signed)
lvm to schedule visit  

## 2020-01-05 ENCOUNTER — Encounter: Admission: RE | Payer: Self-pay | Source: Home / Self Care

## 2020-01-05 ENCOUNTER — Ambulatory Visit
Admission: RE | Admit: 2020-01-05 | Payer: BC Managed Care – PPO | Source: Home / Self Care | Admitting: Gastroenterology

## 2020-01-05 SURGERY — COLONOSCOPY WITH PROPOFOL
Anesthesia: Choice

## 2020-01-10 ENCOUNTER — Ambulatory Visit (INDEPENDENT_AMBULATORY_CARE_PROVIDER_SITE_OTHER): Payer: BC Managed Care – PPO | Admitting: Family Medicine

## 2020-01-10 ENCOUNTER — Other Ambulatory Visit: Payer: Self-pay

## 2020-01-10 DIAGNOSIS — F5101 Primary insomnia: Secondary | ICD-10-CM | POA: Diagnosis not present

## 2020-01-10 MED ORDER — ZOLPIDEM TARTRATE 5 MG PO TABS: ORAL_TABLET | ORAL | 5 refills | Status: DC

## 2020-01-10 NOTE — Progress Notes (Signed)
   Subjective:  Audio only  Patient ID: Melissa Aguilar, female    DOB: Dec 11, 1955, 64 y.o.   MRN: [id number]  HPI Pt here for follow up. Pt states she is doing well. No issues or concerns.  Virtual Visit via Telephone Note  I connected with Oren Section Coppedge on 01/10/20 at  9:00 AM EST by telephone and verified that I am speaking with the correct person using two identifiers.  Location: Patient: home Provider: office   I discussed the limitations, risks, security and privacy concerns of performing an evaluation and management service by telephone and the availability of in person appointments. I also discussed with the patient that there may be a patient responsible charge related to this service. The patient expressed understanding and agreed to proceed.   History of Present Illness:    Observations/Objective:   Assessment and Plan:   Follow Up Instructions:    I discussed the assessment and treatment plan with the patient. The patient was provided an opportunity to ask questions and all were answered. The patient agreed with the plan and demonstrated an understanding of the instructions.   The patient was advised to call back or seek an in-person evaluation if the symptoms worsen or if the condition fails to improve as anticipated.  I provided 20 minutes of non-face-to-face time during this encounter.   Patient compliant with insomnia medication. Generally takes most nights. No obvious morning drowsiness. Definitely helps patient sleep. Without it patient states would not get a good nights rest.     Review of Systems No headache no chest pain no shortness of    Objective:   Physical Exam   Virtual     Assessment & Plan:  Impression insomnia.  Substantial.  Aguilar medication.  Definitely helps.  No negative side effects.  Meds refilled.  Advised patient we need to discuss this yearly.  We can refill her request in 6 months without requiring office visit

## 2020-01-18 ENCOUNTER — Ambulatory Visit (INDEPENDENT_AMBULATORY_CARE_PROVIDER_SITE_OTHER): Payer: BC Managed Care – PPO | Admitting: Family Medicine

## 2020-01-18 ENCOUNTER — Other Ambulatory Visit: Payer: Self-pay

## 2020-01-18 DIAGNOSIS — J310 Chronic rhinitis: Secondary | ICD-10-CM | POA: Diagnosis not present

## 2020-01-18 DIAGNOSIS — J329 Chronic sinusitis, unspecified: Secondary | ICD-10-CM | POA: Diagnosis not present

## 2020-01-18 MED ORDER — CLARITHROMYCIN 500 MG PO TABS: 500.0000 mg | ORAL_TABLET | Freq: Two times a day (BID) | ORAL | 0 refills | Status: DC

## 2020-01-18 NOTE — Progress Notes (Signed)
   Subjective:  Audio only  Patient ID: Melissa Aguilar, female    DOB: 01-13-1956, 64 y.o.   MRN: [id number]  Sinusitis This is a new problem. Episode onset: 2 days. Associated symptoms include sneezing. (Body aches, glands swollen in neck) Past treatments include oral decongestants.   Pt states she will not get a covid test because it will affect her husbands job.   sneezin a lot  Glands a little swollen  Using prn sudefed  No sig fever, maybe low gr  Recalls around no one   Virtual Visit via Telephone Note  I connected with Melissa Aguilar on 01/18/20 at  3:00 PM EST by telephone and verified that I am speaking with the correct person using two identifiers.  Location: Patient: home Provider: office   I discussed the limitations, risks, security and privacy concerns of performing an evaluation and management service by telephone and the availability of in person appointments. I also discussed with the patient that there may be a patient responsible charge related to this service. The patient expressed understanding and agreed to proceed.   History of Present Illness:    Observations/Objective:   Assessment and Plan:   Follow Up Instructions:    I discussed the assessment and treatment plan with the patient. The patient was provided an opportunity to ask questions and all were answered. The patient agreed with the plan and demonstrated an understanding of the instructions.   The patient was advised to call back or seek an in-person evaluation if the symptoms worsen or if the condition fails to improve as anticipated.  I provided 24 minutes of non-face-to-face time during this encounter.       Review of Systems  HENT: Positive for sneezing.        Objective:   Physical Exam   Virtual     Assessment & Plan:  Impression rhinosinusitis.  This is the likely diagnosis.  Discussed.  Will cover with antibiotics.  Potential for Covid also discussed.   Patient states she does not feel it is Covid and she does not plan on getting tested at this regard.  Warning signs discussed carefully

## 2020-01-19 ENCOUNTER — Other Ambulatory Visit: Payer: Self-pay | Admitting: Family Medicine

## 2020-01-19 ENCOUNTER — Telehealth: Payer: Self-pay | Admitting: Family Medicine

## 2020-01-19 MED ORDER — HYDROCODONE-HOMATROPINE 5-1.5 MG/5ML PO SYRP: ORAL_SOLUTION | ORAL | 0 refills | Status: DC

## 2020-01-19 NOTE — Telephone Encounter (Signed)
Left voicemail on Melissa Aguilar [facility] machine cancelling script. Left message for patient to return call

## 2020-01-19 NOTE — Telephone Encounter (Signed)
Patient had phone visit yesterday for sinus infection.She states having a bad cough and requesting hydrocodone cough syrup to be called into [facility]

## 2020-01-19 NOTE — Telephone Encounter (Signed)
Pt notified on voicemail that script requested was sent to pharm.

## 2020-01-19 NOTE — Telephone Encounter (Signed)
Hycodan syrup, 3 ounces, 1 teaspoon every 6 hours as needed cough caution drowsiness for infrequent use use only  Please pend sent back to me then I will sign

## 2020-01-19 NOTE — Telephone Encounter (Signed)
Please advise. Thank you

## 2020-01-19 NOTE — Telephone Encounter (Signed)
Script pended and will call pt after its signed

## 2020-01-19 NOTE — Telephone Encounter (Signed)
Nurses The medication was sent to the [facility] in [place] Please encourage patient only use this medication when at home she should not use it with the Valium it can cause drowsiness no driving with the medicine  Also when the medication was pended the [facility] the [facility] prescription was for the [facility] in [place] which is where I sent for prescription-please call [facility] in [place] leave a message on their answering machine to cancel that prescription And I corrected it and sent it to the [facility] in [place] thank you

## 2020-03-27 ENCOUNTER — Other Ambulatory Visit: Payer: Self-pay

## 2020-03-27 ENCOUNTER — Ambulatory Visit: Payer: BC Managed Care – PPO | Admitting: Nurse Practitioner

## 2020-03-27 ENCOUNTER — Encounter: Payer: Self-pay | Admitting: Nurse Practitioner

## 2020-03-27 VITALS — BP 150/90 | Temp 95.5°F | Wt 197.6 lb

## 2020-03-27 DIAGNOSIS — L03011 Cellulitis of right finger: Secondary | ICD-10-CM

## 2020-03-27 MED ORDER — NAPROXEN 500 MG PO TABS: 500.0000 mg | ORAL_TABLET | Freq: Two times a day (BID) | ORAL | 0 refills | Status: DC

## 2020-03-27 MED ORDER — CLINDAMYCIN HCL 300 MG PO CAPS: 300.0000 mg | ORAL_CAPSULE | Freq: Three times a day (TID) | ORAL | 0 refills | Status: DC

## 2020-03-27 NOTE — Progress Notes (Signed)
   Subjective:    Patient ID: Melissa Aguilar, female    DOB: 1956/10/26, 64 y.o.   MRN: [id number]  HPI Pt here today due to right middle finger being infected. Pt had nails done this past weekend. Right middle finger is painful, swollen, throbbing and had pain radiating up arm. Pt has tried Percocet, Tylenol and Aleve.    Review of Systems     Objective:   Physical Exam        Assessment & Plan:

## 2020-03-27 NOTE — Progress Notes (Signed)
   Subjective:    Patient ID: Melissa Aguilar, female    DOB: 1956/08/30, 64 y.o.   MRN: [id number]  HPI Patient presents to clinic for swelling in her right middle finger at the nailbed. Patient reports pain and "feeling a heart beat" in the finger tip. Patient reports recently getting a manicure.     Review of Systems  Constitutional: Negative for activity change, fatigue and fever.  Skin: Negative for color change, pallor, rash and wound.       Reports tight skin, pulsating feeling in the tip of the right middle finger.        Objective:   Physical Exam Skin:    General: Skin is warm.     Findings: No abrasion, abscess, acne, laceration, lesion, petechiae, rash or wound.     Nails: There is no clubbing.          Comments: No noted pustule or fluid draining from the area. No streaking up the hand or finger.        Assessment & Plan:   Problem List Items Addressed This Visit    None    Visit Diagnoses    Paronychia of finger of right hand    -  Primary   Relevant Medications   clindamycin (CLEOCIN) 300 MG capsule     Meds ordered this encounter  Medications  . clindamycin (CLEOCIN) 300 MG capsule    Sig: Take 1 capsule (300 mg total) by mouth 3 (three) times daily.    Dispense:  30 capsule    Refill:  0    Order Specific Question:   Supervising Provider    Answer:   Sallee Lange A [[id number]]  . naproxen (NAPROSYN) 500 MG tablet    Sig: Take 1 tablet (500 mg total) by mouth 2 (two) times daily with a meal. PRN pain    Dispense:  30 tablet    Refill:  0    Order Specific Question:   Supervising Provider    Answer:   Sallee Lange A [[id number]]  Discussed risks with patient of Paronychia and warning signs to look for for increasing infection. Patient verbalized understanding to look for systemic fever, streaks of red going up hands, and severe increase in swelling. Patient to try warm soaks in conjunction with medication regiment and follow up with the office in the next  few days if worsening. Call back in 48 hours if no improvement.  Otherwise recommend routine follow up.

## 2020-03-29 ENCOUNTER — Telehealth: Payer: Self-pay | Admitting: Family Medicine

## 2020-03-29 NOTE — Telephone Encounter (Signed)
Pt states the tip of her finger went numb yesterday and having white pus come out of it. No fever. [facility]

## 2020-03-29 NOTE — Telephone Encounter (Signed)
Pt saw Melissa Aguilar for an infection in her finger the other day and is calling stating the finger is still numb at the tip and festering some. She wants to know if there is anything she should be concerned with. She is still taking the medication that was prescribed.

## 2020-03-29 NOTE — Telephone Encounter (Signed)
The pressure should improve now that some of the drainage is coming out. Continue warm soaks and antibiotics. Watch for fever or spreading infection.  Call back Monday if no improvement. Go to Urgent Care over the weekend if worse.

## 2020-03-29 NOTE — Telephone Encounter (Signed)
Left message to return call 

## 2020-03-29 NOTE — Telephone Encounter (Signed)
Pt contacted and verbalized understanding.  

## 2020-04-01 ENCOUNTER — Ambulatory Visit: Payer: BC Managed Care – PPO | Admitting: Family Medicine

## 2020-04-01 ENCOUNTER — Other Ambulatory Visit: Payer: Self-pay

## 2020-04-01 ENCOUNTER — Encounter: Payer: Self-pay | Admitting: Family Medicine

## 2020-04-01 VITALS — BP 136/74 | Temp 97.6°F | Wt 197.2 lb

## 2020-04-01 DIAGNOSIS — L03011 Cellulitis of right finger: Secondary | ICD-10-CM | POA: Diagnosis not present

## 2020-04-01 MED ORDER — SULFAMETHOXAZOLE-TRIMETHOPRIM 800-160 MG PO TABS
1.0000 | ORAL_TABLET | Freq: Two times a day (BID) | ORAL | 0 refills | Status: DC
Start: 2020-04-01 — End: 2020-07-11

## 2020-04-01 MED ORDER — HYDROCODONE-ACETAMINOPHEN 5-325 MG PO TABS: 1.0000 | ORAL_TABLET | Freq: Four times a day (QID) | ORAL | 0 refills | Status: DC | PRN

## 2020-04-01 NOTE — Progress Notes (Signed)
   Subjective:    Patient ID: Melissa Aguilar, female    DOB: 1956-05-15, 64 y.o.   MRN: [id number]  HPI Patient comes in today with complaints of Paronychia of right middle finger.  Patient was seen last week and given Clindamycin.  Patient experienced severe pain over the weekend and used needle to allow pus to drain.  Patient also complaints of feeling weak and very fatigued over the last couple of days. Ongoing finger pain  Review of Systems No high fevers    Objective:   Physical Exam  Alert vitals stable, NAD. Blood pressure good on repeat. HEENT normal. Lungs clear. Heart regular rate and rhythm. Paronychial infection with abscess.  Patient was anesthetized incised pus drained dressing applied      Assessment & Plan:  Impression suboptimal response to clindamycin with further development of frank abscess.  Wound care discussed.  Stop clindamycin.  Start Bactrim DS twice daily for 10 days

## 2020-05-07 DIAGNOSIS — E6609 Other obesity due to excess calories: Secondary | ICD-10-CM | POA: Insufficient documentation

## 2020-05-07 DIAGNOSIS — Z6833 Body mass index (BMI) 33.0-33.9, adult: Secondary | ICD-10-CM | POA: Insufficient documentation

## 2020-07-11 ENCOUNTER — Telehealth: Payer: Self-pay | Admitting: Family Medicine

## 2020-07-11 MED ORDER — ZOLPIDEM TARTRATE 5 MG PO TABS: ORAL_TABLET | ORAL | 1 refills | Status: DC

## 2020-07-11 NOTE — Telephone Encounter (Signed)
Patient would like a refill on zolpidem (AMBIEN) 5 MG tablet    [facility]

## 2020-07-11 NOTE — Telephone Encounter (Signed)
Please advise. Thank you

## 2020-07-12 NOTE — Telephone Encounter (Signed)
Please contact patient to schedule her appt. Thank you!

## 2020-07-19 NOTE — Telephone Encounter (Signed)
Left message to schedule appointment for followup

## 2020-08-06 ENCOUNTER — Ambulatory Visit: Payer: BC Managed Care – PPO | Admitting: Family Medicine

## 2020-08-27 ENCOUNTER — Encounter: Payer: Self-pay | Admitting: Family Medicine

## 2020-08-27 ENCOUNTER — Other Ambulatory Visit: Payer: Self-pay

## 2020-08-27 ENCOUNTER — Ambulatory Visit (INDEPENDENT_AMBULATORY_CARE_PROVIDER_SITE_OTHER): Payer: Medicare Other | Admitting: Family Medicine

## 2020-08-27 VITALS — BP 130/82 | HR 97 | Temp 97.3°F | Ht 65.0 in | Wt 201.8 lb

## 2020-08-27 DIAGNOSIS — F5101 Primary insomnia: Secondary | ICD-10-CM

## 2020-08-27 NOTE — Progress Notes (Signed)
Patient ID: Melissa Aguilar, female    DOB: February 17, 1956, 64 y.o.   MRN: [id number]   Chief Complaint  Patient presents with  . Establish Care   Subjective:    HPI   Pt here to establish care. No issues or concerns at this time.   Pt seen for f/u insomnia.  Last visit with pcp was for finger infection from a manicure.  Had to have 2 round abx to clear it up.  Going [facility] also.  [facility].  Dr. Doy Mince- with [facility], is her pcp for over 10 yrs. Husband going here as pcp. Pt only taking ambien prn.  Not taking nightly. Started them before retired. Had stressful job in past. Having fatigue all the time. Has been on it since 2017 in the chart. Has been on it longer than that per pt.  Taking it "3x per month of ambien."  Medical History Melissa Aguilar has a past medical history of Colon polyps, IBS (irritable bowel syndrome), Leiomyoma of uterus, and Menorrhagia.   Outpatient Encounter Medications as of 08/27/2020  Medication Sig  . Ascorbic Acid (VITAMIN C) 500 MG CHEW Chew by mouth.  . Cholecalciferol (VITAMIN D3) 50 MCG (2000 UT) capsule Take by mouth.  . dicyclomine (BENTYL) 20 MG tablet Take 20 mg by mouth 4 (four) times daily.  . vitamin B-12 (CYANOCOBALAMIN) 1000 MCG tablet Take by mouth.  . zolpidem (AMBIEN) 5 MG tablet TAKE 1 TABLET BY MOUTH ONCE DAILY AT BEDTIME AS NEEDED FOR SLEEP  . [DISCONTINUED] clindamycin (CLEOCIN) 300 MG capsule Take 1 capsule (300 mg total) by mouth 3 (three) times daily.  . [DISCONTINUED] naproxen (NAPROSYN) 500 MG tablet Take 1 tablet (500 mg total) by mouth 2 (two) times daily with a meal. PRN pain   No facility-administered encounter medications on file as of 08/27/2020.     Review of Systems  Constitutional: Negative for chills and fever.  HENT: Negative for congestion, rhinorrhea and sore throat.   Respiratory: Negative for cough, shortness of breath and wheezing.     Cardiovascular: Negative for chest pain and leg swelling.  Gastrointestinal: Negative for abdominal pain, diarrhea, nausea and vomiting.  Genitourinary: Negative for dysuria and frequency.  Musculoskeletal: Negative for arthralgias and back pain.  Skin: Negative for rash.  Neurological: Negative for dizziness, weakness and headaches.  Psychiatric/Behavioral: Positive for sleep disturbance. Negative for dysphoric mood. The patient is not nervous/anxious.      Vitals BP 130/82   Pulse 97   Temp (!) 97.3 F (36.3 C)   Ht 5\' 5"  (1.651 m)   Wt 201 lb 12.8 oz (91.5 kg)   SpO2 98%   BMI 33.58 kg/m   Objective:   Physical Exam Vitals and nursing note reviewed.  Constitutional:      General: She is not in acute distress.    Appearance: Normal appearance. She is normal weight. She is not ill-appearing.  Pulmonary:     Effort: No respiratory distress.  Musculoskeletal:        General: No swelling, tenderness, deformity or signs of injury. Normal range of motion.     Cervical back: Normal range of motion.     Right lower leg: No edema.     Left lower leg: No edema.  Skin:    General: Skin is warm and dry.     Findings: No rash.  Neurological:     General: No focal deficit present.  Mental Status: She is alert and oriented to person, place, and time.     Cranial Nerves: No cranial nerve deficit.     Sensory: No sensory deficit.     Motor: No weakness.     Gait: Gait normal.     Deep Tendon Reflexes: Reflexes normal.  Psychiatric:        Mood and Affect: Mood normal.        Behavior: Behavior normal.      Assessment and Plan   1. Primary insomnia   Reviewed with pt needing to decided if she wants to come to [place] as pcp, that per the [facility] policy you can't have more than 1 pcp.  On her insurance card her pcp is Dr. Richarda Overlie at [facility].  Pt was coming to our clinic as an urgent care and to get her ambien prescriptions.  I will discuss further with the Manager  if this is allowable. Reviewed concerns of being on Ambien for long periods of time and likely would not continue to prescribe this and would consider an alternative for sleep if she decided to continue to come to our office.  Pt stating her last pcp wouldn't prescribe it to her, so she was coming here to get it from Dr. Richardson Landry.   Pt in agreement.  Will be in contact with pt on the decision from the manager.

## 2020-11-26 ENCOUNTER — Encounter: Payer: Self-pay | Admitting: Family Medicine

## 2020-11-26 ENCOUNTER — Other Ambulatory Visit: Payer: Self-pay

## 2020-11-26 ENCOUNTER — Telehealth: Payer: Self-pay | Admitting: Family Medicine

## 2020-11-26 ENCOUNTER — Telehealth (INDEPENDENT_AMBULATORY_CARE_PROVIDER_SITE_OTHER): Payer: Medicare PPO | Admitting: Family Medicine

## 2020-11-26 DIAGNOSIS — R059 Cough, unspecified: Secondary | ICD-10-CM

## 2020-11-26 DIAGNOSIS — B349 Viral infection, unspecified: Secondary | ICD-10-CM | POA: Diagnosis not present

## 2020-11-26 MED ORDER — BENZONATATE 100 MG PO CAPS: 100.0000 mg | ORAL_CAPSULE | Freq: Two times a day (BID) | ORAL | 0 refills | Status: DC | PRN

## 2020-11-26 NOTE — Telephone Encounter (Signed)
Ms. Mandrell,you are scheduled for a virtual visit with your provider today.    Just as we do with appointments in the office, we must obtain your consent to participate.  Your consent will be active for this visit and any virtual visit you may have with one of our providers in the next 365 days.    If you have a MyChart account, I can also send a copy of this consent to you electronically.  All virtual visits are billed to your insurance company just like a traditional visit in the office.  As this is a virtual visit, video technology does not allow for your provider to perform a traditional examination.  This may limit your provider's ability to fully assess your condition.  If your provider identifies any concerns that need to be evaluated in person or the need to arrange testing such as labs, EKG, etc, we will make arrangements to do so.    Although advances in technology are sophisticated, we cannot ensure that it will always work on either your end or our end.  If the connection with a video visit is poor, we may have to switch to a telephone visit.  With either a video or telephone visit, we are not always able to ensure that we have a secure connection.   I need to obtain your verbal consent now.   Are you willing to proceed with your visit today?   Shakiya Mcneary Gossett has provided verbal consent on 11/26/2020 for a virtual visit (video or telephone).   Vicente Males, LPN 4/53/6468  03:21 PM

## 2020-11-26 NOTE — Progress Notes (Signed)
Patient presents today with respiratory illness Number of days present- last Tuesday  Symptoms include- body aches, cough, high fever (99-101), weak, lethargic, headache  Presence of worrisome signs (severe shortness of breath, lethargy, etc.) - lethargic   Recent/current visit to urgent care or ER- none Recent direct exposure to Covid- none  Any current Covid testing- no  Virtual Visit via Telephone Note  I connected with Melissa Aguilar on 11/26/20 at  2:00 PM EST by telephone and verified that I am speaking with the correct person using two identifiers.  Location: Patient: home Provider: office   I discussed the limitations, risks, security and privacy concerns of performing an evaluation and management service by telephone and the availability of in person appointments. I also discussed with the patient that there may be a patient responsible charge related to this service. The patient expressed understanding and agreed to proceed.   History of Present Illness:    Observations/Objective:   Assessment and Plan:   Follow Up Instructions:    I discussed the assessment and treatment plan with the patient. The patient was provided an opportunity to ask questions and all were answered. The patient agreed with the plan and demonstrated an understanding of the instructions.   The patient was advised to call back or seek an in-person evaluation if the symptoms worsen or if the condition fails to improve as anticipated.  I provided 11 minutes of non-face-to-face time during this encounter.        Patient ID: Melissa Aguilar, female    DOB: 19-Nov-1955, 65 y.o.   MRN: [id number]   Chief Complaint  Patient presents with  . Cough   Subjective:  CC: body aches, cough, weakness, fever  This is a new problem.  Presents today via telephone visit with a complaint of body aches, cough, weakness, headache, and lethargy.  Symptoms started on Tuesday, T-max 101.0.  Has  tried over-the-counter cough medication and extra strength Tylenol.  She does not think she has a sinus infection, denies any shortness of breath or chest pain.  Reports she had some nausea, this has resolved.  Her main symptom to treat is cough today.    Medical History Melissa Aguilar has a past medical history of Colon polyps, IBS (irritable bowel syndrome), Leiomyoma of uterus, and Menorrhagia.   Outpatient Encounter Medications as of 11/26/2020  Medication Sig  . Ascorbic Acid (VITAMIN C) 500 MG CHEW Chew by mouth.  . benzonatate (TESSALON) 100 MG capsule Take 1 capsule (100 mg total) by mouth 2 (two) times daily as needed for cough.  . Cholecalciferol (VITAMIN D3) 50 MCG (2000 UT) capsule Take by mouth.  . dicyclomine (BENTYL) 20 MG tablet Take 20 mg by mouth 4 (four) times daily.  . vitamin B-12 (CYANOCOBALAMIN) 1000 MCG tablet Take by mouth.  . zolpidem (AMBIEN) 5 MG tablet TAKE 1 TABLET BY MOUTH ONCE DAILY AT BEDTIME AS NEEDED FOR SLEEP   No facility-administered encounter medications on file as of 11/26/2020.     Review of Systems  Constitutional: Positive for chills, fatigue and fever.  HENT: Positive for congestion and sinus pressure. Negative for ear pain, sinus pain and sore throat.   Respiratory: Positive for cough and wheezing. Negative for chest tightness and shortness of breath.   Cardiovascular: Negative for chest pain.  Gastrointestinal: Positive for nausea. Negative for abdominal pain, diarrhea and vomiting.       Resolved  Genitourinary: Negative for dysuria.  Musculoskeletal: Positive for myalgias.  Neurological: Positive for  weakness and headaches.     Vitals There were no vitals taken for this visit. Reports she does not have a pulse oximeter meter.   Objective:   Physical Exam Unable, she is able to converse with me throughout telephone visit without obvious shortness of breath.  Assessment and Plan   1. Cough in adult - benzonatate (TESSALON) 100 MG  capsule; Take 1 capsule (100 mg total) by mouth 2 (two) times daily as needed for cough.  Dispense: 20 capsule; Refill: 0  2. Acute viral syndrome   Recommend COVID testing, [place] website information given for testing sites.  She declines albuterol inhaler, and does not think she has a sinus infection.  Will treat with Tessalon Perles.  Agrees with plan of care discussed today. Understands warning signs to seek further care: chest pain, shortness of breath, any significant change in health.  Understands to follow-up if symptoms do not improve, or worsen.  Recommend COVID 19 testing, information given.  COVID respiratory warnings given as stated below.  Recommend supportive therapy, adequate hydration and symptom management.  Feels this infection was gotten while visiting a nursing home, encouraged her to avoid going back to the nursing home until all symptoms have resolved.   Covid-19 warning:  Covid-19 is a virus that causes hypoxia (low oxygen level in blood) in some people. If you develop any changes in your usual breathing pattern: difficulty catching your breath, more short winded with activity or with resting, or anything that concerns you about your breathing, do not hesitate to go to the emergency department immediately for evaluation. Covid infection can also affect the way the brain functions if it lacks oxygen, such as, feeling dizzy, passing out, or feeling confused, if you experience any of these symptoms, please do not delay to seek treatment.  Some people experience gastrointestinal problems with Covid, such as vomiting and diarrhea, dehydration is a serious risk and should be avoided. If you are unable to keep liquids down you may need to go to the emergency department for intravenous fluids to avoid dehydration.   Please alert and involve your family and/or friends to help keep an eye on you while you recover from Covid-19. If you have any questions or concerns about your  recovery, please do not hesitate to call the office for guidance.     Chalmers Guest, NP 11/26/2020

## 2020-11-29 ENCOUNTER — Other Ambulatory Visit: Payer: Self-pay

## 2020-11-29 ENCOUNTER — Ambulatory Visit
Admission: EM | Admit: 2020-11-29 | Discharge: 2020-11-29 | Disposition: A | Discharge status: Home / Self Care | Payer: Medicare PPO | Attending: Family Medicine | Admitting: Family Medicine

## 2020-11-29 DIAGNOSIS — Z1152 Encounter for screening for COVID-19: Secondary | ICD-10-CM

## 2020-11-29 NOTE — ED Triage Notes (Signed)
Needs covid test

## 2020-11-30 LAB — NOVEL CORONAVIRUS, NAA: SARS-CoV-2, NAA: DETECTED — AB

## 2020-11-30 LAB — SARS-COV-2, NAA 2 DAY TAT

## 2020-12-06 ENCOUNTER — Telehealth: Payer: Self-pay

## 2020-12-06 DIAGNOSIS — R059 Cough, unspecified: Secondary | ICD-10-CM

## 2020-12-06 MED ORDER — BENZONATATE 100 MG PO CAPS: 100.0000 mg | ORAL_CAPSULE | Freq: Two times a day (BID) | ORAL | 0 refills | Status: DC | PRN

## 2020-12-06 NOTE — Telephone Encounter (Signed)
Pt contacted and verbalized understanding.  

## 2020-12-06 NOTE — Telephone Encounter (Signed)
Needs refill on benzonatate (TESSALON) 100 MG capsule [place] [place] - [id number]   Pt call back [phone]

## 2020-12-06 NOTE — Telephone Encounter (Signed)
Please advise. Thank you

## 2020-12-06 NOTE — Telephone Encounter (Signed)
Okay to refill. It looks like she is positive for Covid. Please give warning Thanks, KD  Covid-19 warning:  Covid-19 is a virus that causes hypoxia (low oxygen level in blood) in some people. If you develop any changes in your usual breathing pattern: difficulty catching your breath, more short winded with activity or with resting, or anything that concerns you about your breathing, do not hesitate to go to the emergency department immediately for evaluation. Covid infection can also affect the way the brain functions if it lacks oxygen, such as, feeling dizzy, passing out, or feeling confused, if you experience any of these symptoms, please do not delay to seek treatment.  Some people experience gastrointestinal problems with Covid, such as vomiting and diarrhea, dehydration is a serious risk and should be avoided. If you are unable to keep liquids down you may need to go to the emergency department for intravenous fluids to avoid dehydration.   Please alert and involve your family and/or friends to help keep an eye on you while you recover from Covid-19. If you have any questions or concerns about your recovery, please do not hesitate to call the office for guidance.

## 2021-02-18 DIAGNOSIS — I83813 Varicose veins of bilateral lower extremities with pain: Secondary | ICD-10-CM | POA: Diagnosis not present

## 2021-02-18 DIAGNOSIS — I83893 Varicose veins of bilateral lower extremities with other complications: Secondary | ICD-10-CM | POA: Diagnosis not present

## 2021-02-24 ENCOUNTER — Other Ambulatory Visit: Payer: Self-pay | Admitting: Family Medicine

## 2021-02-24 DIAGNOSIS — Z1231 Encounter for screening mammogram for malignant neoplasm of breast: Secondary | ICD-10-CM

## 2021-03-05 DIAGNOSIS — I83813 Varicose veins of bilateral lower extremities with pain: Secondary | ICD-10-CM | POA: Diagnosis not present

## 2021-03-05 DIAGNOSIS — I83893 Varicose veins of bilateral lower extremities with other complications: Secondary | ICD-10-CM | POA: Diagnosis not present

## 2021-03-15 ENCOUNTER — Encounter: Payer: Self-pay | Admitting: Emergency Medicine

## 2021-03-15 ENCOUNTER — Ambulatory Visit
Admission: EM | Admit: 2021-03-15 | Discharge: 2021-03-15 | Disposition: A | Discharge status: Home / Self Care | Payer: Medicare PPO | Attending: Family Medicine | Admitting: Family Medicine

## 2021-03-15 ENCOUNTER — Other Ambulatory Visit: Payer: Self-pay

## 2021-03-15 DIAGNOSIS — J069 Acute upper respiratory infection, unspecified: Secondary | ICD-10-CM

## 2021-03-15 LAB — POCT RAPID STREP A (OFFICE): Rapid Strep A Screen: NEGATIVE

## 2021-03-15 MED ORDER — IPRATROPIUM BROMIDE 0.03 % NA SOLN: 2.0000 | Freq: Two times a day (BID) | NASAL | 0 refills | Status: DC

## 2021-03-15 MED ORDER — PROMETHAZINE-DM 6.25-15 MG/5ML PO SYRP: 5.0000 mL | ORAL_SOLUTION | Freq: Four times a day (QID) | ORAL | 0 refills | Status: DC | PRN

## 2021-03-15 MED ORDER — LEVOCETIRIZINE DIHYDROCHLORIDE 5 MG PO TABS: 5.0000 mg | ORAL_TABLET | Freq: Every evening | ORAL | 0 refills | Status: DC

## 2021-03-15 NOTE — ED Provider Notes (Signed)
[facility]    CSN: [id number] Arrival date & time: 03/15/21  0803      History   Chief Complaint Chief Complaint  Patient presents with  . Sore Throat    HPI Melissa Aguilar is a 65 y.o. female.   HPI  Patient presents with URI symptoms including cough, sore throat, otalgia, nasal congestion, runny nose, and sinus pressure. Patient  Is afebrile. She has taking otc medication without relief. Symptoms present x 3 days. Past Medical History:  Diagnosis Date  . Colon polyps   . IBS (irritable bowel syndrome)   . Leiomyoma of uterus   . Menorrhagia     Patient Active Problem List   Diagnosis Date Noted  . Cough in adult 11/26/2020  . Acute viral syndrome 11/26/2020  . Anxiety 09/16/2017  . Irritable bowel syndrome 09/16/2017  . Insomnia 09/16/2017  . 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 BREAST BIOPSY Left 10/1995   neg core  . NO PAST SURGERIES      OB History    Gravida  2   Para  1   Term      Preterm      AB  1   Living  1     SAB      IAB      Ectopic      Multiple      Live Births               Home Medications    Prior to Admission medications   Medication Sig Start Date End Date Taking? Authorizing Provider  ipratropium (ATROVENT) 0.03 % nasal spray Place 2 sprays into both nostrils 2 (two) times daily. 03/15/21  Yes Scot Jun, FNP  levocetirizine (XYZAL) 5 MG tablet Take 1 tablet (5 mg total) by mouth every evening. 03/15/21  Yes Scot Jun, FNP  promethazine-dextromethorphan (PROMETHAZINE-DM) 6.25-15 MG/5ML syrup Take 5 mLs by mouth 4 (four) times daily as needed for cough. 03/15/21  Yes Scot Jun, FNP  Ascorbic Acid (VITAMIN C) 500 MG CHEW Chew by mouth.    [provider]  benzonatate (TESSALON) 100 MG capsule Take 1 capsule (100 mg total) by mouth 2 (two) times daily as needed for cough. 12/06/20   Chalmers Guest, NP  Cholecalciferol  (VITAMIN D3) 50 MCG (2000 UT) capsule Take by mouth.    [provider]  dicyclomine (BENTYL) 20 MG tablet Take 20 mg by mouth 4 (four) times daily.    [provider]  vitamin B-12 (CYANOCOBALAMIN) 1000 MCG tablet Take by mouth.    [provider]  zolpidem (AMBIEN) 5 MG tablet TAKE 1 TABLET BY MOUTH ONCE DAILY AT BEDTIME AS NEEDED FOR SLEEP 07/11/20   Erven Colla, DO    Family History Family History  Problem Relation Age of Onset  . Heart disease Father   . Hypertension Father   . Breast cancer Maternal Aunt 80       great aunt  . Multiple myeloma Mother 11    Social History Social History   Tobacco Use  . Smoking status: Never Smoker  . Smokeless tobacco: Never Used  Vaping Use  . Vaping Use: Never used  Substance Use Topics  . Alcohol use: Yes    Comment: occasional  . Drug use: No     Allergies   Amoxil [amoxicillin], Augmentin [amoxicillin-pot clavulanate], Paxil [paroxetine hcl], and Tamiflu [oseltamivir phosphate]  Review of Systems Review of Systems Pertinent negatives listed in HPI   Physical Exam Triage Vital Signs ED Triage Vitals  Enc Vitals Group     BP 03/15/21 0820 (!) 162/94     Pulse Rate 03/15/21 0820 84     Resp 03/15/21 0820 18     Temp 03/15/21 0820 98.1 F (36.7 C)     Temp Source 03/15/21 0820 Oral     SpO2 03/15/21 0820 98 %     Weight --      Height --      Head Circumference --      Peak Flow --      Pain Score 03/15/21 0819 4     Pain Loc --      Pain Edu? --      Excl. in [place]? --    No data found.  Updated Vital Signs BP (!) 162/94 (BP Location: Right Arm)   Pulse 84   Temp 98.1 F (36.7 C) (Oral)   Resp 18   SpO2 98%   Visual Acuity Right Eye Distance:   Left Eye Distance:   Bilateral Distance:    Right Eye Near:   Left Eye Near:    Bilateral Near:     Physical Exam  General Appearance:    Alert, cooperative, no distress  HENT:   Normocephalic, ears normal, nares mucosal edema  with congestion, oropharynx clear   Eyes:    PERRL, conjunctiva/corneas clear, EOM's intact       Lungs:     Clear to auscultation bilaterally, respirations unlabored  Heart:    Regular rate and rhythm  Neurologic:   Awake, alert, oriented x 3. No apparent focal neurological           defect.      UC Treatments / Results  Labs (all labs ordered are listed, but only abnormal results are displayed) Labs Reviewed  CULTURE, GROUP A STREP [facility])  POCT RAPID STREP A (OFFICE)    EKG   Radiology No results found.  Procedures Procedures (including critical care time)  Medications Ordered in UC Medications - No data to display  Initial Impression / Assessment and Plan / UC Course  I have reviewed the triage vital signs and the nursing notes.  Pertinent labs & imaging results that were available during my care of the patient were reviewed by me and considered in my medical decision making (see chart for details).     Viral URI, treatment per discharge medication orders. Take antihistamine daily and consistent. Follow-up with PCP as needed.  Final Clinical Impressions(s) / UC Diagnoses   Final diagnoses:  Upper respiratory tract infection, unspecified type   Discharge Instructions   None    ED Prescriptions    Medication Sig Dispense Auth. Provider   promethazine-dextromethorphan (PROMETHAZINE-DM) 6.25-15 MG/5ML syrup Take 5 mLs by mouth 4 (four) times daily as needed for cough. 140 mL Scot Jun, FNP   levocetirizine (XYZAL) 5 MG tablet Take 1 tablet (5 mg total) by mouth every evening. 90 tablet Scot Jun, FNP   ipratropium (ATROVENT) 0.03 % nasal spray Place 2 sprays into both nostrils 2 (two) times daily. 30 mL Scot Jun, FNP     PDMP not reviewed this encounter.   Scot Jun, [place] 03/18/21 [phone]

## 2021-03-15 NOTE — ED Triage Notes (Addendum)
Sore throat, cough and body aches x 3 days

## 2021-03-26 DIAGNOSIS — I83893 Varicose veins of bilateral lower extremities with other complications: Secondary | ICD-10-CM | POA: Diagnosis not present

## 2021-03-26 DIAGNOSIS — I83813 Varicose veins of bilateral lower extremities with pain: Secondary | ICD-10-CM | POA: Diagnosis not present

## 2021-04-04 ENCOUNTER — Ambulatory Visit: Payer: Medicare PPO | Admitting: Family Medicine

## 2021-04-08 ENCOUNTER — Ambulatory Visit: Payer: Medicare PPO | Admitting: Family Medicine

## 2021-04-10 ENCOUNTER — Ambulatory Visit: Payer: Medicare PPO | Admitting: Family Medicine

## 2021-05-01 ENCOUNTER — Ambulatory Visit
Admission: RE | Admit: 2021-05-01 | Discharge: 2021-05-01 | Disposition: A | Discharge status: Home / Self Care | Payer: Medicare PPO | Source: Ambulatory Visit | Attending: Family Medicine | Admitting: Family Medicine

## 2021-05-01 ENCOUNTER — Encounter: Payer: Self-pay | Admitting: Advanced Practice Midwife

## 2021-05-01 ENCOUNTER — Other Ambulatory Visit: Payer: Self-pay

## 2021-05-01 ENCOUNTER — Ambulatory Visit (INDEPENDENT_AMBULATORY_CARE_PROVIDER_SITE_OTHER): Payer: Medicare PPO | Admitting: Advanced Practice Midwife

## 2021-05-01 VITALS — BP 160/64 | Ht 65.0 in | Wt 202.0 lb

## 2021-05-01 DIAGNOSIS — Z0000 Encounter for general adult medical examination without abnormal findings: Secondary | ICD-10-CM | POA: Diagnosis not present

## 2021-05-01 DIAGNOSIS — Z1231 Encounter for screening mammogram for malignant neoplasm of breast: Secondary | ICD-10-CM | POA: Diagnosis not present

## 2021-05-01 NOTE — Progress Notes (Addendum)
Gynecology Annual Exam  PCP: Erven Colla, DO  Chief Complaint:  Chief Complaint  Patient presents with   Gynecologic Exam    No concerns    History of Present Illness:Patient is a 65 y.o. G2P0011 presents for annual exam. The patient has no complaints today. We reviewed timing of screenings and gyn care as needed. She had a mammogram today that was ordered by her PCP. Her last PAP smear was 1 year ago and was normal.   LMP: No LMP recorded. Patient is postmenopausal.   The patient is sexually active. She denies dyspareunia.  The patient does perform self breast exams.  There is no notable family history of breast or ovarian cancer in her family.  The patient wears seatbelts: yes.   The patient has regular exercise: she has not had regular exercise in the past 2 years she says due to Covid which she thinks is contributing to her elevated blood pressure and weight gain. She admits generally healthy diet and adequate sleep   The patient denies current symptoms of depression.     Review of Systems: Review of Systems  Constitutional:  Positive for malaise/fatigue. Negative for chills and fever.  HENT:  Negative for congestion, ear discharge, ear pain, hearing loss, sinus pain and sore throat.   Eyes:  Negative for blurred vision and double vision.  Respiratory:  Positive for cough. Negative for shortness of breath and wheezing.   Cardiovascular:  Negative for chest pain, palpitations and leg swelling.  Gastrointestinal:  Negative for abdominal pain, blood in stool, constipation, diarrhea, heartburn, melena, nausea and vomiting.  Genitourinary:  Negative for dysuria, flank pain, frequency, hematuria and urgency.  Musculoskeletal:  Positive for joint pain. Negative for back pain and myalgias.  Skin:  Negative for itching and rash.  Neurological:  Negative for dizziness, tingling, tremors, sensory change, speech change, focal weakness, seizures, loss of consciousness, weakness and  headaches.  Endo/Heme/Allergies:  Negative for environmental allergies. Does not bruise/bleed easily.  Psychiatric/Behavioral:  Negative for depression, hallucinations, memory loss, substance abuse and suicidal ideas. The patient is not nervous/anxious and does not have insomnia.    Past Medical History:  Patient Active Problem List   Diagnosis Date Noted   Cough in adult 11/26/2020   Acute viral syndrome 11/26/2020   Anxiety 09/16/2017   Irritable bowel syndrome 09/16/2017   Insomnia 09/16/2017    Overview:  utilizes ambien     Borderline diabetes 09/16/2017    Past Surgical History:  Past Surgical History:  Procedure Laterality Date   BREAST BIOPSY Left 10/1995   neg core    Gynecologic History:  No LMP recorded. Patient is postmenopausal. Last Pap: 1 year ago Results were:  no abnormalities  Last mammogram: 2020 Results were: BI-RAD I  Obstetric History: G2P0011  Family History:  Family History  Problem Relation Age of Onset   Heart disease Father    Hypertension Father    Breast cancer Maternal Aunt 55       great aunt   Multiple myeloma Mother 60    Social History:  Social History   Socioeconomic History   Marital status: Married    Spouse name: Not on file   Number of children: Not on file   Years of education: Not on file   Highest education level: Not on file  Occupational History   Not on file  Tobacco Use   Smoking status: Never   Smokeless tobacco: Never  Vaping Use  Vaping Use: Never used  Substance and Sexual Activity   Alcohol use: Yes    Comment: occasional   Drug use: No   Sexual activity: Yes    Birth control/protection: Post-menopausal  Other Topics Concern   Not on file  Social History Narrative   Not on file   Social Determinants of Health   Financial Resource Strain: Not on file  Food Insecurity: Not on file  Transportation Needs: Not on file  Physical Activity: Not on file  Stress: Not on file  Social Connections: Not  on file  Intimate Partner Violence: Not on file    Allergies:  Allergies  Allergen Reactions   Amoxil [Amoxicillin]     swelling   Augmentin [Amoxicillin-Pot Clavulanate]     swelling   Paxil [Paroxetine Hcl]     confusion   Tamiflu [Oseltamivir Phosphate]     Medications: Prior to Admission medications   Medication Sig Start Date End Date Taking? Authorizing Provider  Ascorbic Acid (VITAMIN C) 500 MG CHEW Chew by mouth.    [provider]  Cholecalciferol (VITAMIN D3) 50 MCG (2000 UT) capsule Take by mouth.    [provider]  dicyclomine (BENTYL) 20 MG tablet Take 20 mg by mouth 4 (four) times daily.    [provider]  vitamin B-12 (CYANOCOBALAMIN) 1000 MCG tablet Take by mouth.    [provider]    Physical Exam Vitals: Blood pressure (!) 160/64, height _0  (1.651 m), weight 202 lb (91.6 kg).  General: NAD HEENT: normocephalic, anicteric Thyroid: no enlargement, no palpable nodules Pulmonary: No increased work of breathing, CTAB Cardiovascular: RRR, distal pulses 2+ Breast: Breast symmetrical, no tenderness, no palpable nodules or masses, no skin or nipple retraction present, no nipple discharge.  No axillary or supraclavicular lymphadenopathy. Abdomen: NABS, soft, non-tender, non-distended.  Umbilicus without lesions.  No hepatomegaly, splenomegaly or masses palpable. No evidence of hernia  Genitourinary: deferred for no concerns/PAP interval Extremities: no edema, erythema, or tenderness Neurologic: Grossly intact Psychiatric: mood appropriate, affect full     Assessment: 65 y.o. G2P0011 routine annual exam  Plan: Problem List Items Addressed This Visit   None Visit Diagnoses     Well woman exam without gynecological exam    -  Primary       1) Mammogram - recommend yearly screening mammogram.  Mammogram  was done earlier today  2) STI screening  was offered and declined  3) ASCCP guidelines and rationale  discussed.  Patient opts for every 5 years screening interval  4) Osteoporosis  - per USPTF routine screening DEXA at age 29  Consider FDA-approved medical therapies in postmenopausal women and men aged 78 years and older, based on the following: a) A hip or vertebral (clinical or morphometric) fracture b) T-score ? -2.5 at the femoral neck or spine after appropriate evaluation to exclude secondary causes C) Low bone mass (T-score between -1.0 and -2.5 at the femoral neck or spine) and a 10-year probability of a hip fracture ? 3% or a 10-year probability of a major osteoporosis-related fracture ? 20% based on the [place]-adapted WHO algorithm   5) Routine healthcare maintenance including cholesterol, diabetes screening discussed managed by PCP  6) Colonoscopy to be ordered by PCP.  Screening recommended starting at age 58 for average risk individuals, age 52 for individuals deemed at increased risk (including African Americans) and recommended to continue until age 46.  For patient age 68-85 individualized approach is recommended.  Gold standard screening is  via colonoscopy, Cologuard screening is an acceptable alternative for patient unwilling or unable to undergo colonoscopy.  "Colorectal cancer screening for average?risk adults: 2018 guideline update from the American Cancer Society"CA: A Cancer Journal for Clinicians: Apr 07, 2017   7) Increase healthy lifestyle; exercise  8) Return in about 2 years (around 05/02/2023) for annual established gyn.    Christean Leaf, CNM [facility] 05/01/21, 3:43 PM

## 2021-05-05 ENCOUNTER — Ambulatory Visit: Payer: Medicare PPO | Admitting: Family Medicine

## 2021-05-20 DIAGNOSIS — Z0000 Encounter for general adult medical examination without abnormal findings: Secondary | ICD-10-CM | POA: Diagnosis not present

## 2021-05-20 DIAGNOSIS — R7303 Prediabetes: Secondary | ICD-10-CM | POA: Diagnosis not present

## 2021-05-23 DIAGNOSIS — E669 Obesity, unspecified: Secondary | ICD-10-CM | POA: Diagnosis not present

## 2021-05-23 DIAGNOSIS — R7303 Prediabetes: Secondary | ICD-10-CM | POA: Diagnosis not present

## 2021-05-23 DIAGNOSIS — Z6833 Body mass index (BMI) 33.0-33.9, adult: Secondary | ICD-10-CM | POA: Diagnosis not present

## 2021-05-23 DIAGNOSIS — Z780 Asymptomatic menopausal state: Secondary | ICD-10-CM | POA: Diagnosis not present

## 2021-05-23 DIAGNOSIS — Z0000 Encounter for general adult medical examination without abnormal findings: Secondary | ICD-10-CM | POA: Diagnosis not present

## 2021-05-23 DIAGNOSIS — F5101 Primary insomnia: Secondary | ICD-10-CM | POA: Diagnosis not present

## 2021-05-23 DIAGNOSIS — Z1389 Encounter for screening for other disorder: Secondary | ICD-10-CM | POA: Diagnosis not present

## 2021-05-28 DIAGNOSIS — Z780 Asymptomatic menopausal state: Secondary | ICD-10-CM | POA: Diagnosis not present

## 2021-07-24 ENCOUNTER — Ambulatory Visit
Admission: EM | Admit: 2021-07-24 | Discharge: 2021-07-24 | Disposition: A | Discharge status: Home / Self Care | Payer: Medicare PPO

## 2021-07-24 ENCOUNTER — Encounter: Payer: Self-pay | Admitting: Emergency Medicine

## 2021-07-24 ENCOUNTER — Emergency Department
Admission: EM | Admit: 2021-07-24 | Discharge: 2021-07-24 | Disposition: A | Discharge status: Home / Self Care | Payer: Medicare PPO | Attending: Emergency Medicine | Admitting: Emergency Medicine

## 2021-07-24 ENCOUNTER — Emergency Department: Payer: Medicare PPO

## 2021-07-24 ENCOUNTER — Other Ambulatory Visit: Payer: Self-pay

## 2021-07-24 DIAGNOSIS — R109 Unspecified abdominal pain: Secondary | ICD-10-CM | POA: Diagnosis not present

## 2021-07-24 DIAGNOSIS — K5720 Diverticulitis of large intestine with perforation and abscess without bleeding: Secondary | ICD-10-CM | POA: Diagnosis not present

## 2021-07-24 DIAGNOSIS — Z20822 Contact with and (suspected) exposure to covid-19: Secondary | ICD-10-CM | POA: Diagnosis not present

## 2021-07-24 DIAGNOSIS — K5792 Diverticulitis of intestine, part unspecified, without perforation or abscess without bleeding: Secondary | ICD-10-CM

## 2021-07-24 LAB — COMPREHENSIVE METABOLIC PANEL
ALT: 63 U/L — ABNORMAL HIGH (ref 0–44)
AST: 45 U/L — ABNORMAL HIGH (ref 15–41)
Albumin: 4.2 g/dL (ref 3.5–5.0)
Alkaline Phosphatase: 91 U/L (ref 38–126)
Anion gap: 9 (ref 5–15)
BUN: 15 mg/dL (ref 8–23)
CO2: 25 mmol/L (ref 22–32)
Calcium: 8.8 mg/dL — ABNORMAL LOW (ref 8.9–10.3)
Chloride: 102 mmol/L (ref 98–111)
Creatinine, Ser: 0.91 mg/dL (ref 0.44–1.00)
GFR, Estimated: >60 mL/min (ref >60)
Glucose, Bld: 119 mg/dL — ABNORMAL HIGH (ref 70–99)
Potassium: 4.1 mmol/L (ref 3.5–5.1)
Sodium: 136 mmol/L (ref 135–145)
Total Bilirubin: 1 mg/dL (ref 0.3–1.2)
Total Protein: 7.5 g/dL (ref 6.5–8.1)

## 2021-07-24 LAB — CBC
HCT: 42.8 % (ref 36.0–46.0)
Hemoglobin: 14.6 g/dL (ref 12.0–15.0)
MCH: 29.5 pg (ref 26.0–34.0)
MCHC: 34.1 g/dL (ref 30.0–36.0)
MCV: 86.5 fL (ref 80.0–100.0)
Platelets: 243 10*3/uL (ref 150–400)
RBC: 4.95 MIL/uL (ref 3.87–5.11)
RDW: 12.6 % (ref 11.5–15.5)
WBC: 13.9 10*3/uL — ABNORMAL HIGH (ref 4.0–10.5)
nRBC: 0 % (ref 0.0–0.2)

## 2021-07-24 LAB — RESP PANEL BY RT-PCR (FLU A&B, COVID) ARPGX2
Influenza A by PCR: NEGATIVE
Influenza B by PCR: NEGATIVE
SARS Coronavirus 2 by RT PCR: NEGATIVE

## 2021-07-24 LAB — URINALYSIS, COMPLETE (UACMP) WITH MICROSCOPIC
Bilirubin Urine: NEGATIVE
Glucose, UA: NEGATIVE mg/dL
Ketones, ur: NEGATIVE mg/dL
Nitrite: NEGATIVE
Protein, ur: NEGATIVE mg/dL
Specific Gravity, Urine: 1.019 (ref 1.005–1.030)
pH: 6 (ref 5.0–8.0)

## 2021-07-24 LAB — LIPASE, BLOOD: Lipase: 26 U/L (ref 11–51)

## 2021-07-24 MED ORDER — ONDANSETRON 4 MG PO TBDP: 4.0000 mg | ORAL_TABLET | Freq: Three times a day (TID) | ORAL | 0 refills | Status: AC | PRN

## 2021-07-24 MED ORDER — CIPROFLOXACIN HCL 500 MG PO TABS: 500.0000 mg | ORAL_TABLET | Freq: Two times a day (BID) | ORAL | 0 refills | Status: AC

## 2021-07-24 MED ORDER — IOHEXOL 350 MG/ML SOLN
80.0000 mL | Freq: Once | INTRAVENOUS | Status: AC | PRN
  Administered 2021-07-24: 80 mL via INTRAVENOUS

## 2021-07-24 MED ORDER — OXYCODONE HCL 5 MG PO TABS
5.0000 mg | ORAL_TABLET | Freq: Three times a day (TID) | ORAL | 0 refills | Status: AC | PRN
Start: 2021-07-24 — End: 2021-07-27

## 2021-07-24 MED ORDER — KETOROLAC TROMETHAMINE 30 MG/ML IJ SOLN
15.0000 mg | Freq: Once | INTRAMUSCULAR | Status: AC
  Administered 2021-07-24: 15 mg via INTRAVENOUS
  Filled 2021-07-24: qty 1

## 2021-07-24 MED ORDER — METRONIDAZOLE 500 MG PO TABS: 500.0000 mg | ORAL_TABLET | Freq: Three times a day (TID) | ORAL | 0 refills | Status: AC

## 2021-07-24 MED ORDER — ACETAMINOPHEN 325 MG PO TABS
650.0000 mg | ORAL_TABLET | Freq: Once | ORAL | Status: AC
  Administered 2021-07-24: 650 mg via ORAL
  Filled 2021-07-24: qty 2

## 2021-07-24 MED ORDER — FENTANYL CITRATE PF 50 MCG/ML IJ SOSY
50.0000 ug | PREFILLED_SYRINGE | Freq: Once | INTRAMUSCULAR | Status: DC
Start: 2021-07-24 — End: 2021-07-25
  Filled 2021-07-24: qty 1

## 2021-07-24 NOTE — ED Triage Notes (Signed)
All over abd pain 

## 2021-07-24 NOTE — ED Triage Notes (Signed)
Pt arrives POV with CC of medial abd pain that began on 9/13. Pt reports pain lessens when not moving. Pt does report taking old prescription of percocet (325) at home for pain relief without relief - last dose at 0300 this am. Last bowel movement yesterday - denies nausea/vomiting.

## 2021-07-24 NOTE — ED Notes (Signed)
Patient is being discharged from the Urgent Care and sent to the Emergency Department via pov . Per [place], [place], patient is in need of higher level of care due to abd pain. Patient is aware and verbalizes understanding of plan of care.  Vitals:   07/24/21 0915  BP: (!) 167/78  Pulse: 84  Resp: 18  Temp: 98.7 F (37.1 C)  SpO2: 98%

## 2021-07-24 NOTE — ED Notes (Signed)
Patient transported to CT 

## 2021-07-24 NOTE — Discharge Instructions (Addendum)
Take the antibiotics to help with the infection.  Take Zofran, nausea.  Use Tylenol 1 g every 8 hours and ibuprofen 600 every 6 hours with food.  Try to keep with a light bland diet.  Take the oxycodone for breakthrough pain.  Do not use with your zolpidem and do not drive  Colonscopy in 6 weeks.   Return to the ER for worsening pain, worsening fevers or any other concern   Take oxycodone as prescribed. Do not drink alcohol, drive or participate in any other potentially dangerous activities while taking this medication as it may make you sleepy. Do not take this medication with any other sedating medications, either prescription or over-the-counter. If you were prescribed Percocet or Vicodin, do not take these with acetaminophen (Tylenol) as it is already contained within these medications.  This medication is an opiate (or narcotic) pain medication and can be habit forming. Use it as little as possible to achieve adequate pain control. Do not use or use it with extreme caution if you have a history of opiate abuse or dependence. If you are on a pain contract with your primary care doctor or a pain specialist, be sure to let them know you were prescribed this medication today from the Emergency Department. This medication is intended for your use only - do not give any to anyone else and keep it in a secure place where nobody else, especially children, have access to it.

## 2021-07-24 NOTE — ED Provider Notes (Signed)
[facility] Emergency Department Provider Note  ____________________________________________   Event Date/Time   First MD Initiated Contact with Patient 07/24/21 1512     (approximate)  I have reviewed the triage vital signs and the nursing notes.   HISTORY  Chief Complaint Abdominal Pain    HPI Melissa Aguilar is a 65 y.o. female with leiomyoma of uterus who comes in with concerns for abdominal pain.  Patient reporting abdominal pain that started 2 days ago patient has been taking some Percocet at home for pain relief without relief.  Last dose at 3 AM.  Last bowel movement was yesterday.  Denies any dysuria.  The pain is intermittent, in the lower abdomen, worse with walking and movement.,  Sharp and stabbing in nature.  Denies having this previously except for maybe 1 episode 1 time that was very minimal in nature.              Past Medical History:  Diagnosis Date   Colon polyps    IBS (irritable bowel syndrome)    Leiomyoma of uterus    Menorrhagia     Patient Active Problem List   Diagnosis Date Noted   Cough in adult 11/26/2020   Acute viral syndrome 11/26/2020   Class 1 obesity due to excess calories with serious comorbidity and body mass index (BMI) of 33.0 to 33.9 in adult 05/07/2020   Anxiety 09/16/2017   Irritable bowel syndrome 09/16/2017   Insomnia 09/16/2017   Borderline diabetes 09/16/2017    Past Surgical History:  Procedure Laterality Date   BREAST BIOPSY Left 10/1995   neg core    Prior to Admission medications   Medication Sig Start Date End Date Taking? Authorizing Provider  Ascorbic Acid (VITAMIN C) 500 MG CHEW Chew by mouth.    [provider]  Cholecalciferol (VITAMIN D3) 50 MCG (2000 UT) capsule Take by mouth.    [provider]  dicyclomine (BENTYL) 20 MG tablet Take 20 mg by mouth 4 (four) times daily.    [provider]  vitamin B-12 (CYANOCOBALAMIN) 1000 MCG tablet Take by  mouth.    [provider]    Allergies Amoxil [amoxicillin], Augmentin [amoxicillin-pot clavulanate], Paxil [paroxetine hcl], and Tamiflu [oseltamivir phosphate]  Family History  Problem Relation Age of Onset   Heart disease Father    Hypertension Father    Breast cancer Maternal Aunt 1       great aunt   Multiple myeloma Mother 7    Social History Social History   Tobacco Use   Smoking status: Never   Smokeless tobacco: Never  Vaping Use   Vaping Use: Never used  Substance Use Topics   Alcohol use: Yes    Comment: occasional   Drug use: No      Review of Systems Constitutional: No fever/chills Eyes: No visual changes. ENT: No sore throat. Cardiovascular: Denies chest pain. Respiratory: Denies shortness of breath. Gastrointestinal: + abd pain  Genitourinary: Negative for dysuria. Musculoskeletal: Negative for back pain. Skin: Negative for rash. Neurological: Negative for headaches, focal weakness or numbness. All other ROS negative ____________________________________________   PHYSICAL EXAM:  VITAL SIGNS: ED Triage Vitals  Enc Vitals Group     BP 07/24/21 1110 (!) 170/75     Pulse Rate 07/24/21 1110 88     Resp 07/24/21 1110 17     Temp 07/24/21 1110 99.3 F (37.4 C)     Temp Source 07/24/21 1110 Oral     SpO2  07/24/21 1110 99 %     Weight 07/24/21 1111 204 lb (92.5 kg)     Height 07/24/21 1111 '5\' 5"'  (1.651 m)     Head Circumference --      Peak Flow --      Pain Score 07/24/21 1111 5     Pain Loc --      Pain Edu? --      Excl. in [place]? --     Constitutional: Alert and oriented. Well appearing and in no acute distress. Eyes: Conjunctivae are normal. EOMI. Head: Atraumatic. Nose: No congestion/rhinnorhea. Mouth/Throat: Mucous membranes are moist.   Neck: No stridor. Trachea Midline. FROM Cardiovascular: Normal rate, regular rhythm. Grossly normal heart sounds.  Good peripheral circulation. Respiratory: Normal respiratory effort.  No  retractions. Lungs CTAB. Gastrointestinal: tender lower abdomen. No distention. No abdominal bruits.  Musculoskeletal: No lower extremity tenderness nor edema.  No joint effusions. Neurologic:  Normal speech and language. No gross focal neurologic deficits are appreciated.  Skin:  Skin is warm, dry and intact. No rash noted. Psychiatric: Mood and affect are normal. Speech and behavior are normal. GU: Deferred   ____________________________________________   LABS (all labs ordered are listed, but only abnormal results are displayed)  Labs Reviewed  COMPREHENSIVE METABOLIC PANEL - Abnormal; Notable for the following components:      Result Value   Glucose, Bld 119 (*)    Calcium 8.8 (*)    AST 45 (*)    ALT 63 (*)    All other components within normal limits  CBC - Abnormal; Notable for the following components:   WBC 13.9 (*)    All other components within normal limits  URINALYSIS, COMPLETE (UACMP) WITH MICROSCOPIC - Abnormal; Notable for the following components:   Color, Urine YELLOW (*)    APPearance HAZY (*)    Hgb urine dipstick SMALL (*)    Leukocytes,Ua MODERATE (*)    Bacteria, UA RARE (*)    All other components within normal limits  LIPASE, BLOOD   ____________________________________________   ____________________________________________  RADIOLOGY   Official radiology report(s): CT ABDOMEN PELVIS W CONTRAST  Result Date: 07/24/2021 CLINICAL DATA:  Abdominal pain and distension for several days. EXAM: CT ABDOMEN AND PELVIS WITH CONTRAST TECHNIQUE: Multidetector CT imaging of the abdomen and pelvis was performed using the standard protocol following bolus administration of intravenous contrast. CONTRAST:  41m OMNIPAQUE IOHEXOL 350 MG/ML SOLN COMPARISON:  None. FINDINGS: Lower Chest: No acute findings. Hepatobiliary: No hepatic masses identified. Gallbladder is unremarkable. No evidence of biliary ductal dilatation. Pancreas:  No mass or inflammatory changes.  Spleen: Within normal limits in size and appearance. Adrenals/Urinary Tract: No masses identified. A few small renal cysts are seen bilaterally. No evidence of ureteral calculi or hydronephrosis. Stomach/Bowel: Moderate diverticulitis is seen involving the proximal sigmoid colon. No evidence of perforation or abscess. No evidence of bowel obstruction. Vascular/Lymphatic: No pathologically enlarged lymph nodes. No acute vascular findings. Reproductive:  No mass or other significant abnormality. Other:  None. Musculoskeletal:  No suspicious bone lesions identified. IMPRESSION: Moderate sigmoid diverticulitis. No evidence of abscess or other complication. Electronically Signed   By: JMarlaine HindM.D.   On: 07/24/2021 17:07    ____________________________________________   PROCEDURES  Procedure(s) performed (including Critical Care):  Procedures   ____________________________________________   INITIAL IMPRESSION / ASSESSMENT AND PLAN / ED COURSE  Melissa Aguilar was evaluated in Emergency Department on 07/24/2021 for the symptoms described in the history of present illness. She was  evaluated in the context of the global COVID-19 pandemic, which necessitated consideration that the patient might be at risk for infection with the SARS-CoV-2 virus that causes COVID-19. Institutional protocols and algorithms that pertain to the evaluation of patients at risk for COVID-19 are in a state of rapid change based on information released by regulatory bodies including the CDC and federal and state organizations. These policies and algorithms were followed during the patient's care in the ED.    Comes in with abdominal pain.  Patient tender in the lower abdomen.  Will get CT scan to evaluate for any appendicitis, diverticulitis, abscess, perforation.   COVID test is negative.  Labs show slightly elevated white count but patient is afebrile and remains well-appearing.  Her LFTs are also slightly elevated.   Urine has a lot of squamous cells so difficult to interpret but denies any urinary symptoms to suggest UTI  CT scan does show diverticulitis.  On repeat evaluation patient remains well-appearing there is no complication such as abscess.  Patient given some Tylenol and Toradol for pain and as long as her pain is well controlled she will go home on some oral antibiotics.  She is been tolerating p.o.  She understands that she needs to follow-up for colonoscopy in 6 weeks given this is her first episode.  On repeat evaluation she feels comfortable with going home  I discussed the provisional nature of ED diagnosis, the treatment so far, the ongoing plan of care, follow up appointments and return precautions with the patient and any family or support people present. They expressed understanding and agreed with the plan, discharged home.         ____________________________________________   FINAL CLINICAL IMPRESSION(S) / ED DIAGNOSES   Final diagnoses:  Diverticulitis      MEDICATIONS GIVEN DURING THIS VISIT:  Medications  fentaNYL (SUBLIMAZE) injection 50 mcg (50 mcg Intravenous Not Given 07/24/21 1736)  iohexol (OMNIPAQUE) 350 MG/ML injection 80 mL (80 mLs Intravenous Contrast Given 07/24/21 1639)  ketorolac (TORADOL) 30 MG/ML injection 15 mg (15 mg Intravenous Given 07/24/21 1820)  acetaminophen (TYLENOL) tablet 650 mg (650 mg Oral Given 07/24/21 1820)     ED Discharge Orders          Ordered    metroNIDAZOLE (FLAGYL) 500 MG tablet  3 times daily        07/24/21 1851    ciprofloxacin (CIPRO) 500 MG tablet  2 times daily        07/24/21 1851    ondansetron (ZOFRAN ODT) 4 MG disintegrating tablet  Every 8 hours PRN        07/24/21 1851    oxyCODONE (ROXICODONE) 5 MG immediate release tablet  Every 8 hours PRN        07/24/21 1851             Note:  This document was prepared using Dragon voice recognition software and may include unintentional dictation errors.     Vanessa Boscobel, MD 07/24/21 [phone]

## 2021-07-24 NOTE — ED Notes (Signed)
Pt given ginger-ale, pt tolerated fine.

## 2021-07-29 DIAGNOSIS — Z09 Encounter for follow-up examination after completed treatment for conditions other than malignant neoplasm: Secondary | ICD-10-CM | POA: Diagnosis not present

## 2021-07-29 DIAGNOSIS — K5792 Diverticulitis of intestine, part unspecified, without perforation or abscess without bleeding: Secondary | ICD-10-CM | POA: Diagnosis not present

## 2021-08-05 DIAGNOSIS — Z0000 Encounter for general adult medical examination without abnormal findings: Secondary | ICD-10-CM | POA: Diagnosis not present

## 2021-08-05 DIAGNOSIS — R7303 Prediabetes: Secondary | ICD-10-CM | POA: Diagnosis not present

## 2021-11-03 ENCOUNTER — Other Ambulatory Visit: Payer: Self-pay

## 2021-11-03 ENCOUNTER — Ambulatory Visit
Admission: EM | Admit: 2021-11-03 | Discharge: 2021-11-03 | Disposition: A | Discharge status: Home / Self Care | Payer: Medicare PPO | Attending: Family Medicine | Admitting: Family Medicine

## 2021-11-03 DIAGNOSIS — Z1152 Encounter for screening for COVID-19: Secondary | ICD-10-CM

## 2021-11-03 DIAGNOSIS — J069 Acute upper respiratory infection, unspecified: Secondary | ICD-10-CM

## 2021-11-03 MED ORDER — PREDNISONE 20 MG PO TABS: 40.0000 mg | ORAL_TABLET | Freq: Every day | ORAL | 0 refills | Status: DC

## 2021-11-03 MED ORDER — PROMETHAZINE-DM 6.25-15 MG/5ML PO SYRP: 5.0000 mL | ORAL_SOLUTION | Freq: Four times a day (QID) | ORAL | 0 refills | Status: DC | PRN

## 2021-11-03 NOTE — ED Triage Notes (Signed)
Pt presents with c/o cough and nasal congestion since wednesday

## 2021-11-03 NOTE — ED Provider Notes (Signed)
[facility]    CSN: [id number] Arrival date & time: 11/03/21  0802      History   Chief Complaint Chief Complaint  Patient presents with   Cough   Nasal Congestion    HPI Melissa Aguilar is a 65 y.o. female.   Presenting today with 5-day history of productive cough, nasal congestion, fatigue.  Denies fever, chills, chest pain, shortness of breath, abdominal pain, nausea vomiting or diarrhea.  Multiple sick contacts recently but unsure with what.  No known history of chronic pulmonary disease.  Taking over-the-counter cold and congestion medication with no relief.  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 Authorizing Provider  predniSONE (DELTASONE) 20 MG tablet Take 2 tablets (40 mg total) by mouth daily with breakfast. 11/03/21  Yes Volney American, PA-C  promethazine-dextromethorphan (PROMETHAZINE-DM) 6.25-15 MG/5ML syrup Take 5 mLs by mouth 4 (four) times daily as needed. 11/03/21  Yes Volney American, PA-C  Ascorbic Acid (VITAMIN C) 500 MG CHEW Chew by mouth.    [provider]  Cholecalciferol (VITAMIN D3) 50 MCG  (2000 UT) capsule Take by mouth.    [provider]  dicyclomine (BENTYL) 20 MG tablet Take 20 mg by mouth 4 (four) times daily.    [provider]  ondansetron (ZOFRAN ODT) 4 MG disintegrating tablet Take 1 tablet (4 mg total) by mouth every 8 (eight) hours as needed for nausea or vomiting. 07/24/21   Vanessa Atoka, MD  vitamin B-12 (CYANOCOBALAMIN) 1000 MCG tablet Take by mouth.    [provider]    Family History Family History  Problem Relation Age of Onset   Heart disease Father    Hypertension Father    Breast cancer Maternal Aunt 16       great aunt   Multiple myeloma Mother 14    Social History Social History   Tobacco Use   Smoking status: Never   Smokeless tobacco: Never  Vaping Use   Vaping Use: Never used  Substance Use Topics   Alcohol use: Yes    Comment: occasional   Drug use: No     Allergies   Amoxil [amoxicillin], Augmentin [amoxicillin-pot clavulanate], Paxil [paroxetine hcl], and Tamiflu [oseltamivir phosphate]   Review of Systems Review of Systems  Per HPI  Physical Exam Triage Vital Signs ED Triage Vitals  Enc Vitals Group     BP 11/03/21 0816 (!) 141/96     Pulse Rate 11/03/21 0816 97     Resp 11/03/21 0816 20     Temp 11/03/21 0816 98.3 F (36.8 C)     Temp src --      SpO2 11/03/21 0816 99 %     Weight --      Height --      Head Circumference --      Peak Flow --      Pain Score 11/03/21 0814 0     Pain Loc --      Pain Edu? --      Excl. in [place]? --    No data found.  Updated Vital Signs BP (!) 141/96    Pulse 97    Temp 98.3 F (36.8 C)    Resp 20    SpO2 99%   Visual Acuity Right Eye Distance:   Left Eye Distance:   Bilateral Distance:    Right Eye Near:   Left Eye Near:    Bilateral Near:     Physical Exam Vitals and nursing note reviewed.  Constitutional:      Appearance: Normal appearance.  HENT:     Head: Atraumatic.     Right Ear: Tympanic membrane and external ear normal.      Left Ear: Tympanic membrane and external ear normal.     Nose: Rhinorrhea present.     Mouth/Throat:     Mouth: Mucous membranes are moist.     Pharynx: Posterior oropharyngeal erythema present.  Eyes:     Extraocular Movements: Extraocular movements intact.     Conjunctiva/sclera: Conjunctivae normal.  Cardiovascular:     Rate and Rhythm: Normal rate and regular rhythm.     Heart sounds: Normal heart sounds.  Pulmonary:     Effort: Pulmonary effort is normal.     Breath sounds: Normal breath sounds. No wheezing or rales.  Musculoskeletal:        General: Normal range of motion.     Cervical back: Normal range of motion and neck supple.  Skin:    General: Skin is warm and dry.  Neurological:     Mental Status: She is alert and oriented to person, place, and time.  Psychiatric:        Mood and Affect: Mood normal.        Thought Content: Thought content normal.   UC Treatments / Results  Labs (all labs ordered are listed, but only abnormal results are displayed) Labs Reviewed  COVID-19, FLU A+B NAA   EKG  Radiology No results found.  Procedures Procedures (including critical care time)  Medications Ordered in UC Medications - No data to display  Initial Impression / Assessment and Plan / UC Course  I have reviewed the triage vital signs and the nursing notes.  Pertinent labs & imaging results that were available during my care of the patient were reviewed by me and considered in my medical decision making (see chart for details).     Vital signs very reassuring, exam showing evidence of a viral upper respiratory infection.  COVID and flu test pending, will treat symptomatically with prednisone, Phenergan DM in the meantime.  Discussed over-the-counter supportive medications and home care.  Return for acutely worsening symptoms.  Final Clinical Impressions(s) / UC Diagnoses   Final diagnoses:  Viral URI with cough  Discharge Instructions   None    ED  Prescriptions     Medication Sig Dispense Auth. Provider   predniSONE (DELTASONE) 20 MG tablet Take 2 tablets (40 mg total) by mouth daily with breakfast. 10 tablet Volney American, PA-C   promethazine-dextromethorphan (PROMETHAZINE-DM) 6.25-15 MG/5ML syrup Take 5 mLs by mouth 4 (four) times daily as needed. 100 mL Volney American, [place]      PDMP not reviewed this encounter.   Volney American, [place] 11/03/21 1120

## 2021-11-05 LAB — COVID-19, FLU A+B NAA
Influenza A, NAA: NOT DETECTED
Influenza B, NAA: NOT DETECTED
SARS-CoV-2, NAA: DETECTED — AB

## 2021-11-06 ENCOUNTER — Telehealth (HOSPITAL_COMMUNITY): Payer: Self-pay | Admitting: Emergency Medicine

## 2021-11-06 MED ORDER — PROMETHAZINE-DM 6.25-15 MG/5ML PO SYRP: 5.0000 mL | ORAL_SOLUTION | Freq: Four times a day (QID) | ORAL | 0 refills | Status: DC | PRN

## 2021-11-06 NOTE — Telephone Encounter (Signed)
Patient requested cough medicine refill, okay'd by Dr. Lanny Cramp

## 2021-11-07 ENCOUNTER — Telehealth: Payer: Self-pay | Admitting: Family Medicine

## 2021-11-07 MED ORDER — PROMETHAZINE-DM 6.25-15 MG/5ML PO SYRP: 5.0000 mL | ORAL_SOLUTION | Freq: Four times a day (QID) | ORAL | 0 refills | Status: DC | PRN

## 2021-11-07 NOTE — Telephone Encounter (Signed)
Refill for cough syrup sent per patient request

## 2021-11-14 DIAGNOSIS — R7303 Prediabetes: Secondary | ICD-10-CM | POA: Diagnosis not present

## 2021-11-24 DIAGNOSIS — F5101 Primary insomnia: Secondary | ICD-10-CM | POA: Diagnosis not present

## 2021-11-24 DIAGNOSIS — Z6833 Body mass index (BMI) 33.0-33.9, adult: Secondary | ICD-10-CM | POA: Diagnosis not present

## 2021-11-24 DIAGNOSIS — E1169 Type 2 diabetes mellitus with other specified complication: Secondary | ICD-10-CM | POA: Diagnosis not present

## 2021-11-24 DIAGNOSIS — E6609 Other obesity due to excess calories: Secondary | ICD-10-CM | POA: Diagnosis not present

## 2021-11-24 DIAGNOSIS — E669 Obesity, unspecified: Secondary | ICD-10-CM | POA: Diagnosis not present

## 2021-11-24 DIAGNOSIS — F419 Anxiety disorder, unspecified: Secondary | ICD-10-CM | POA: Diagnosis not present

## 2021-12-23 ENCOUNTER — Ambulatory Visit
Admission: EM | Admit: 2021-12-23 | Discharge: 2021-12-23 | Disposition: A | Discharge status: Home / Self Care | Payer: Medicare PPO | Attending: Student | Admitting: Student

## 2021-12-23 ENCOUNTER — Other Ambulatory Visit: Payer: Self-pay

## 2021-12-23 DIAGNOSIS — R030 Elevated blood-pressure reading, without diagnosis of hypertension: Secondary | ICD-10-CM

## 2021-12-23 DIAGNOSIS — B349 Viral infection, unspecified: Secondary | ICD-10-CM

## 2021-12-23 MED ORDER — PROMETHAZINE-DM 6.25-15 MG/5ML PO SYRP: 5.0000 mL | ORAL_SOLUTION | Freq: Four times a day (QID) | ORAL | 0 refills | Status: DC | PRN

## 2021-12-23 MED ORDER — PREDNISONE 20 MG PO TABS: 40.0000 mg | ORAL_TABLET | Freq: Every day | ORAL | 0 refills | Status: AC

## 2021-12-23 NOTE — ED Triage Notes (Signed)
Pt presents with c/o sore throat and cough that began yesterday

## 2021-12-23 NOTE — Discharge Instructions (Signed)
-  Prednisone, 2 pills taken at the same time for 5 days in a row.  Try taking this earlier in the day as it can give you energy. Avoid NSAIDs like ibuprofen and alleve while taking this medication as they can increase your risk of stomach upset and even GI bleeding when in combination with a steroid. You can continue tylenol (acetaminophen) up to 1000mg  3x daily. -Promethazine DM cough syrup for congestion/cough. This could make you drowsy, so take at night before bed. -Continue over-the-counter medications for additional relief.  -Please check your blood pressure at home or at the pharmacy. If this continues to be >140/90, follow-up with your primary care provider for further blood pressure management/ medication titration. If you develop chest pain, shortness of breath, vision changes, the worst headache of your life- head straight to the ED or call 911.

## 2021-12-23 NOTE — ED Provider Notes (Signed)
[facility]    CSN: [id number] Arrival date & time: 12/23/21  6712      History   Chief Complaint Chief Complaint  Patient presents with   Sore Throat   Cough    HPI Melissa Aguilar is a 66 y.o. female presenting with sore throat and cough for 2 days.  Medical history viral syndrome, COVID-19 x3.  Describes sore throat, worse with swallowing.  Denies difficulty tolerating fluids and food, denies sensation of throat closing.  Also with nonproductive cough.  Denies myalgias.  Has taken Tylenol with relief.  HPI  Past Medical History:  Diagnosis Date   Colon polyps    IBS (irritable bowel syndrome)    Leiomyoma of uterus    Menorrhagia     Patient Active Problem List   Diagnosis Date Noted   Cough in adult 11/26/2020   Acute viral syndrome 11/26/2020   Class 1 obesity due to excess calories with serious comorbidity and body mass index (BMI) of 33.0 to 33.9 in adult 05/07/2020   Anxiety 09/16/2017   Irritable bowel syndrome 09/16/2017   Insomnia 09/16/2017   Borderline diabetes 09/16/2017    Past Surgical History:  Procedure Laterality Date   BREAST BIOPSY Left 10/1995   neg core    OB History     Gravida  2   Para  1   Term      Preterm      AB  1   Living  1      SAB      IAB      Ectopic      Multiple      Live Births               Home Medications    Prior to Admission medications   Medication Sig Start Date End Date Taking? Authorizing Provider  Ascorbic Acid (VITAMIN C) 500 MG CHEW Chew by mouth.    [provider]  Cholecalciferol (VITAMIN D3) 50 MCG (2000 UT) capsule Take by mouth.    [provider]  dicyclomine (BENTYL) 20 MG tablet Take 20 mg by mouth 4 (four) times daily.    [provider]  ondansetron (ZOFRAN ODT) 4 MG disintegrating tablet Take 1 tablet (4 mg total) by mouth every 8 (eight) hours as needed for nausea or vomiting. 07/24/21   Vanessa Merwin, MD  predniSONE  (DELTASONE) 20 MG tablet Take 2 tablets (40 mg total) by mouth daily with breakfast for 5 days. 12/23/21 12/28/21  Hazel Sams, PA-C  promethazine-dextromethorphan (PROMETHAZINE-DM) 6.25-15 MG/5ML syrup Take 5 mLs by mouth 4 (four) times daily as needed. 12/23/21   Hazel Sams, PA-C  vitamin B-12 (CYANOCOBALAMIN) 1000 MCG tablet Take by mouth.    [provider]    Family History Family History  Problem Relation Age of Onset   Heart disease Father    Hypertension Father    Breast cancer Maternal Aunt 38       great aunt   Multiple myeloma Mother 76    Social History Social History   Tobacco Use   Smoking status: Never   Smokeless tobacco: Never  Vaping Use   Vaping Use: Never used  Substance Use Topics   Alcohol use: Yes    Comment: occasional   Drug use: No     Allergies   Amoxil [amoxicillin], Augmentin [amoxicillin-pot clavulanate], Paxil [paroxetine hcl], and Tamiflu [oseltamivir phosphate]   Review of Systems Review of Systems  Constitutional:  Negative for appetite change, chills and fever.  HENT:  Positive for congestion and sore throat. Negative for ear pain, rhinorrhea, sinus pressure and sinus pain.   Eyes:  Negative for redness and visual disturbance.  Respiratory:  Positive for cough. Negative for chest tightness, shortness of breath and wheezing.   Cardiovascular:  Negative for chest pain and palpitations.  Gastrointestinal:  Negative for abdominal pain, constipation, diarrhea, nausea and vomiting.  Genitourinary:  Negative for dysuria, frequency and urgency.  Musculoskeletal:  Negative for myalgias.  Neurological:  Negative for dizziness, weakness and headaches.  Psychiatric/Behavioral:  Negative for confusion.   All other systems reviewed and are negative.   Physical Exam Triage Vital Signs ED Triage Vitals  Enc Vitals Group     BP 12/23/21 0858 (!) 189/82     Pulse Rate 12/23/21 0858 91     Resp 12/23/21 0858 18     Temp 12/23/21  0858 98.4 F (36.9 C)     Temp src --      SpO2 12/23/21 0858 98 %     Weight --      Height --      Head Circumference --      Peak Flow --      Pain Score 12/23/21 0855 7     Pain Loc --      Pain Edu? --      Excl. in [place]? --    No data found.  Updated Vital Signs BP (!) 189/82    Pulse 91    Temp 98.4 F (36.9 C)    Resp 18    SpO2 98%   Visual Acuity Right Eye Distance:   Left Eye Distance:   Bilateral Distance:    Right Eye Near:   Left Eye Near:    Bilateral Near:     Physical Exam Vitals reviewed.  Constitutional:      General: She is not in acute distress.    Appearance: Normal appearance. She is not ill-appearing.  HENT:     Head: Normocephalic and atraumatic.     Right Ear: Tympanic membrane, ear canal and external ear normal. No tenderness. No middle ear effusion. There is no impacted cerumen. Tympanic membrane is not perforated, erythematous, retracted or bulging.     Left Ear: Tympanic membrane, ear canal and external ear normal. No tenderness.  No middle ear effusion. There is no impacted cerumen. Tympanic membrane is not perforated, erythematous, retracted or bulging.     Nose: Nose normal. No congestion.     Mouth/Throat:     Mouth: Mucous membranes are moist.     Pharynx: Uvula midline. Posterior oropharyngeal erythema present. No oropharyngeal exudate.     Comments: Smooth erythema posterior pharynx.  Tonsils are barely visible.  No exudate. On exam, uvula is midline, she is tolerating her secretions without difficulty, there is no trismus, no drooling, she has normal phonation  Eyes:     Extraocular Movements: Extraocular movements intact.     Pupils: Pupils are equal, round, and reactive to light.  Cardiovascular:     Rate and Rhythm: Normal rate and regular rhythm.     Heart sounds: Normal heart sounds.  Pulmonary:     Effort: Pulmonary effort is normal.     Breath sounds: Normal breath sounds. No decreased breath sounds, wheezing, rhonchi or  rales.  Abdominal:     Palpations: Abdomen is soft.     Tenderness: There is no abdominal tenderness. There is no  guarding or rebound.  Lymphadenopathy:     Cervical: No cervical adenopathy.     Right cervical: No superficial, deep or posterior cervical adenopathy.    Left cervical: No superficial, deep or posterior cervical adenopathy.  Neurological:     General: No focal deficit present.     Mental Status: She is alert and oriented to person, place, and time.  Psychiatric:        Mood and Affect: Mood normal.        Behavior: Behavior normal.        Thought Content: Thought content normal.        Judgment: Judgment normal.     UC Treatments / Results  Labs (all labs ordered are listed, but only abnormal results are displayed) Labs Reviewed - No data to display  EKG   Radiology No results found.  Procedures Procedures (including critical care time)  Medications Ordered in UC Medications - No data to display  Initial Impression / Assessment and Plan / UC Course  I have reviewed the triage vital signs and the nursing notes.  Pertinent labs & imaging results that were available during my care of the patient were reviewed by me and considered in my medical decision making (see chart for details).     This patient is a very pleasant 65 y.o. year old female presenting with viral pharyngitis and cough. Afebrile, nontachy. Trial of prednisone and promethazine DM. Declines covid PCR. ED return precautions discussed. Patient verbalizes understanding and agreement.   For blood pressure -she states that this is typically in normal range, does not take any antihypertensives.  Monitor this at home, follow-up with PCP.  Denies current headache, vision changes, chest pain, shortness of breath, dizziness.   Final Clinical Impressions(s) / UC Diagnoses   Final diagnoses:  Viral syndrome  Elevated blood pressure reading in office without diagnosis of hypertension     Discharge  Instructions      -Prednisone, 2 pills taken at the same time for 5 days in a row.  Try taking this earlier in the day as it can give you energy. Avoid NSAIDs like ibuprofen and alleve while taking this medication as they can increase your risk of stomach upset and even GI bleeding when in combination with a steroid. You can continue tylenol (acetaminophen) up to 1065m 3x daily. -Promethazine DM cough syrup for congestion/cough. This could make you drowsy, so take at night before bed. -Continue over-the-counter medications for additional relief.  -Please check your blood pressure at home or at the pharmacy. If this continues to be >140/90, follow-up with your primary care provider for further blood pressure management/ medication titration. If you develop chest pain, shortness of breath, vision changes, the worst headache of your life- head straight to the ED or call 911.    ED Prescriptions     Medication Sig Dispense Auth. Provider   predniSONE (DELTASONE) 20 MG tablet Take 2 tablets (40 mg total) by mouth daily with breakfast for 5 days. 10 tablet GHazel Sams PA-C   promethazine-dextromethorphan (PROMETHAZINE-DM) 6.25-15 MG/5ML syrup Take 5 mLs by mouth 4 (four) times daily as needed. 100 mL GHazel Sams PA-C      PDMP not reviewed this encounter.   GHazel Sams PA-C 12/23/21 [phone]

## 2021-12-30 DIAGNOSIS — E669 Obesity, unspecified: Secondary | ICD-10-CM | POA: Diagnosis not present

## 2021-12-30 DIAGNOSIS — E1169 Type 2 diabetes mellitus with other specified complication: Secondary | ICD-10-CM | POA: Diagnosis not present

## 2021-12-30 DIAGNOSIS — J209 Acute bronchitis, unspecified: Secondary | ICD-10-CM | POA: Diagnosis not present

## 2022-01-07 DIAGNOSIS — J069 Acute upper respiratory infection, unspecified: Secondary | ICD-10-CM | POA: Diagnosis not present

## 2022-04-02 ENCOUNTER — Other Ambulatory Visit: Payer: Self-pay | Admitting: Family Medicine

## 2022-04-02 DIAGNOSIS — Z1231 Encounter for screening mammogram for malignant neoplasm of breast: Secondary | ICD-10-CM

## 2022-05-04 ENCOUNTER — Ambulatory Visit
Admission: RE | Admit: 2022-05-04 | Discharge: 2022-05-04 | Disposition: A | Discharge status: Home / Self Care | Payer: Medicare PPO | Source: Ambulatory Visit | Attending: Family Medicine | Admitting: Family Medicine

## 2022-05-04 DIAGNOSIS — Z1231 Encounter for screening mammogram for malignant neoplasm of breast: Secondary | ICD-10-CM | POA: Insufficient documentation

## 2022-05-25 IMAGING — CT CT ABD-PELV W/ CM
2 of 5 series · 16 of 46 positions shown, 18 images · IV contrast (APPLIED)
Comparison: None.

CLINICAL DATA: Abdominal pain and distension for several days.

EXAM:
CT ABDOMEN AND PELVIS WITH CONTRAST
TECHNIQUE: Multidetector CT imaging of the abdomen and pelvis was performed
using the standard protocol following bolus administration of
intravenous contrast.
CONTRAST:  80mL OMNIPAQUE IOHEXOL 350 MG/ML SOLN

[Series 2: axial st · axial · 0.98mm/px · z∈[-852,-447]mm · 13 of 93 slices shown, 15 images]
[im 6/93  soft-tissue]
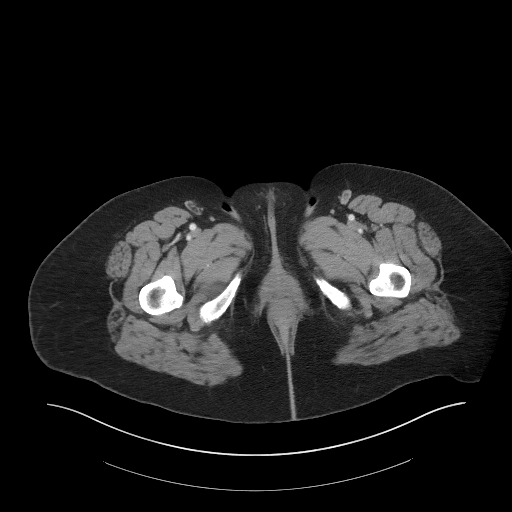
[im 6/93  bone]
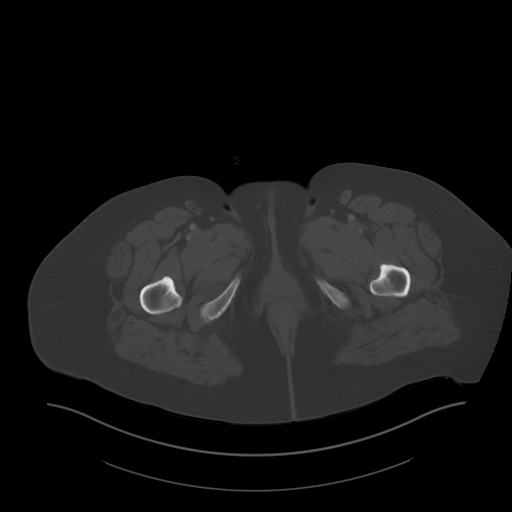
[im 11/93  soft-tissue]
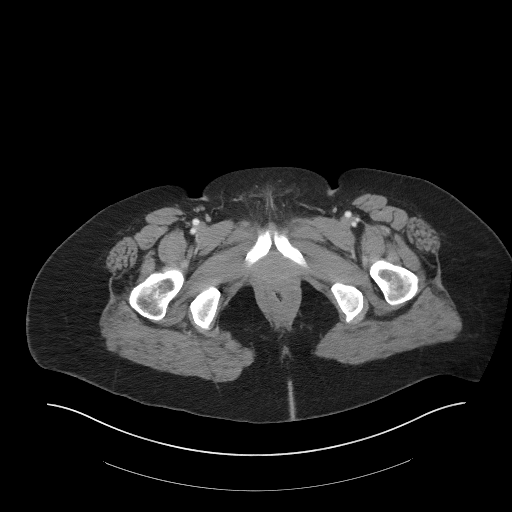
[im 21/93  soft-tissue]
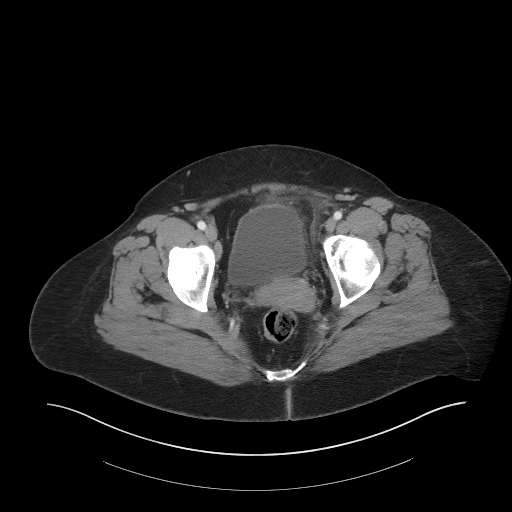
[im 26/93  soft-tissue]
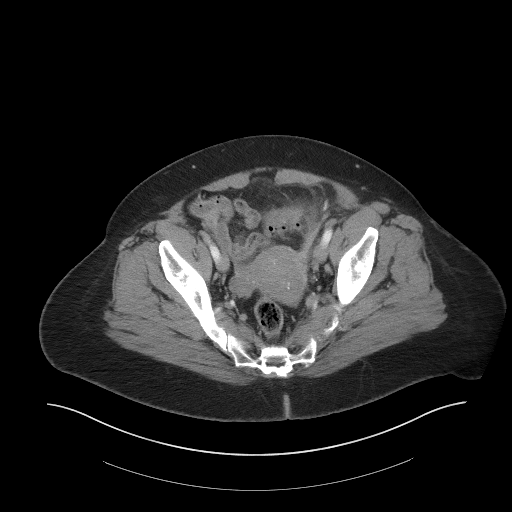
[im 31/93  soft-tissue]
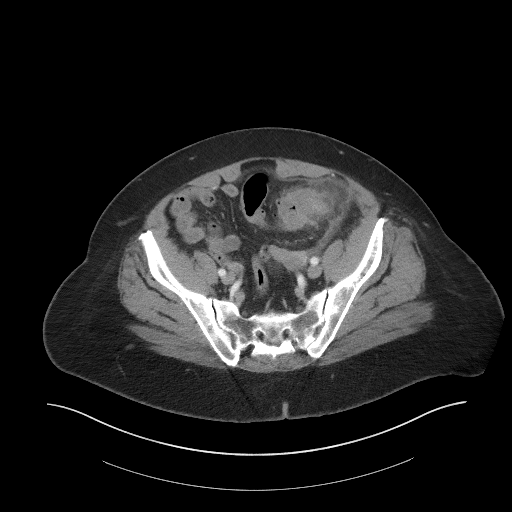
[im 41/93  soft-tissue]
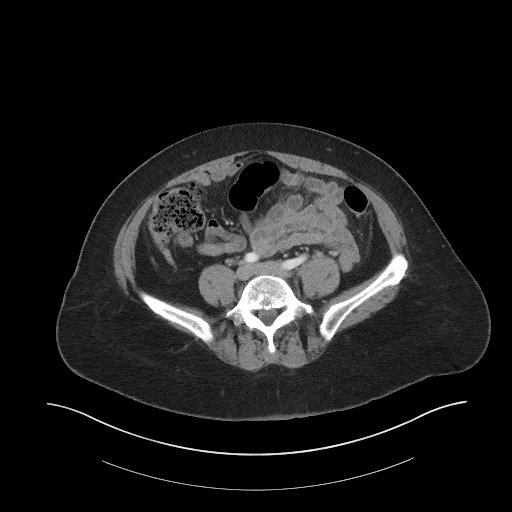
[im 47/93  soft-tissue]
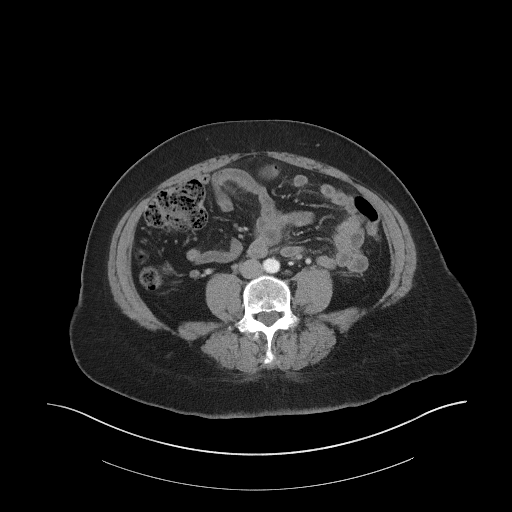
[im 52/93  soft-tissue]
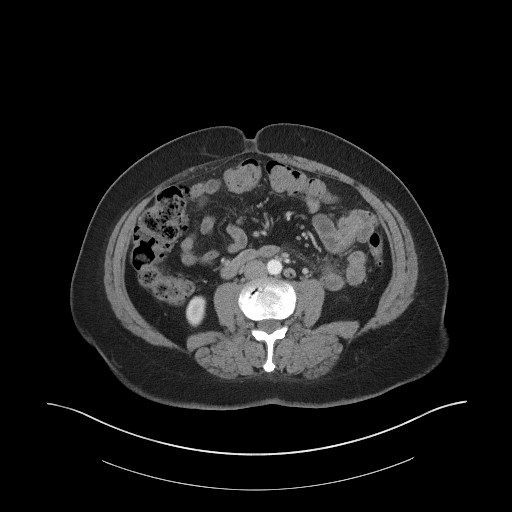
[im 62/93  soft-tissue]
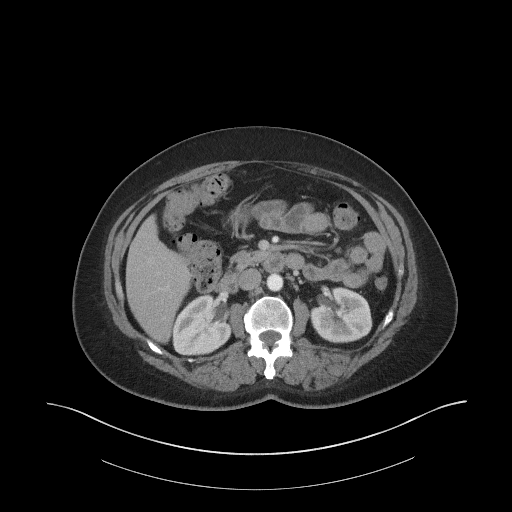
[im 62/93  bone]
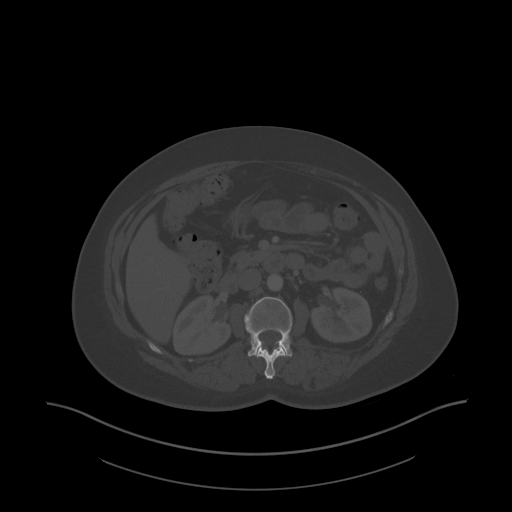
[im 67/93  soft-tissue]
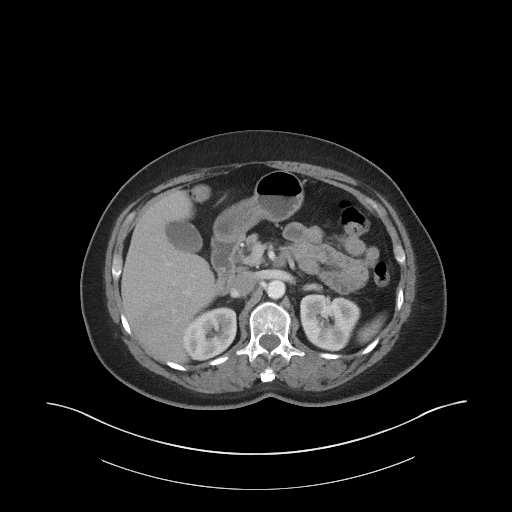
[im 72/93  soft-tissue]
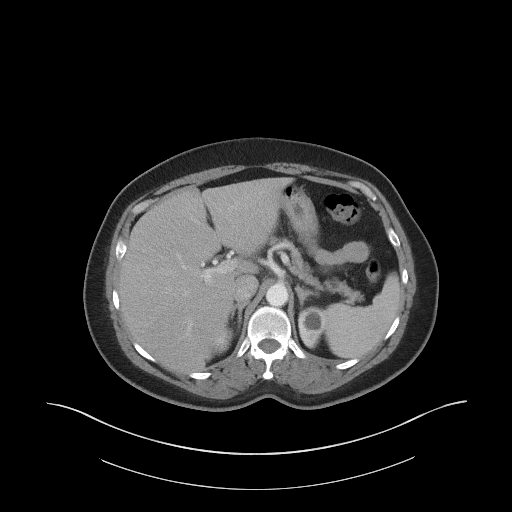
[im 82/93  soft-tissue]
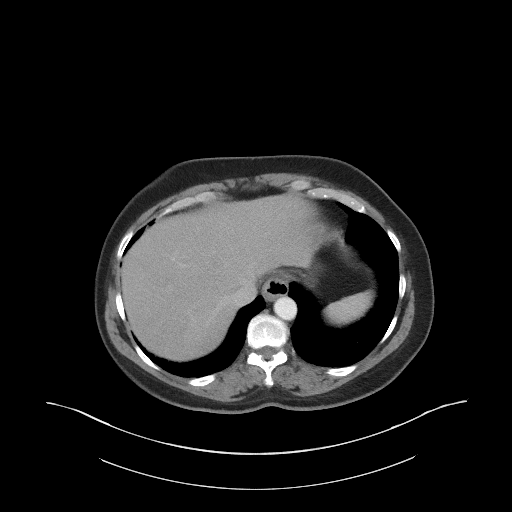
[im 87/93  soft-tissue]
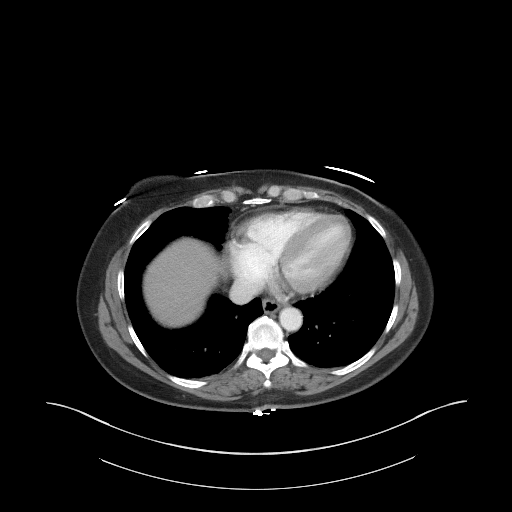

[Series 5: coronal st · coronal · 0.76mm/px · 3 of 100 slices shown]
[im 34/100  soft-tissue]
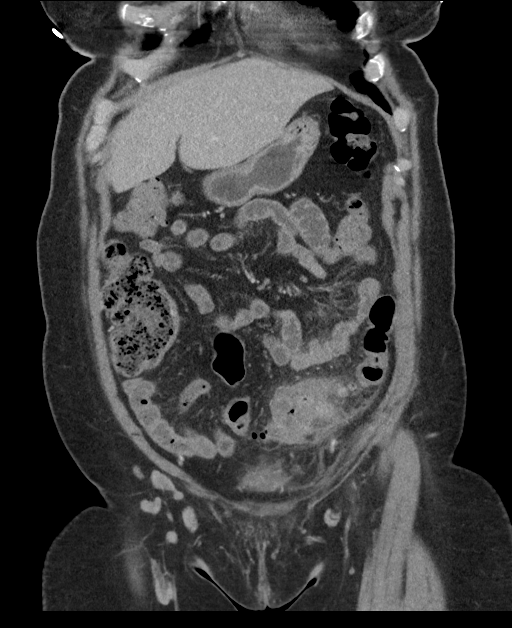
[im 45/100  soft-tissue]
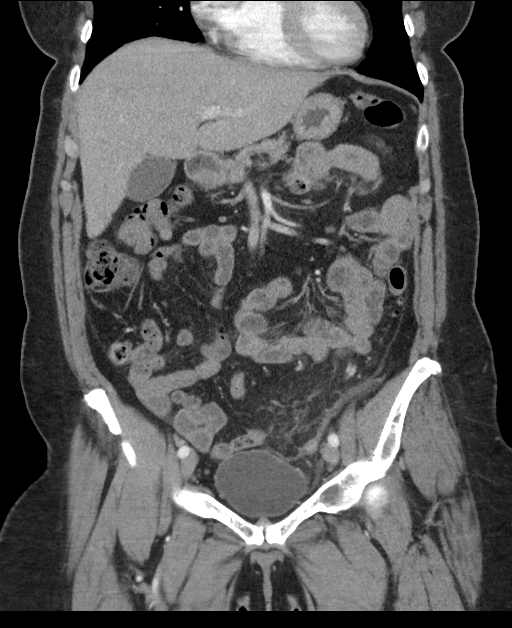
[im 56/100  soft-tissue]
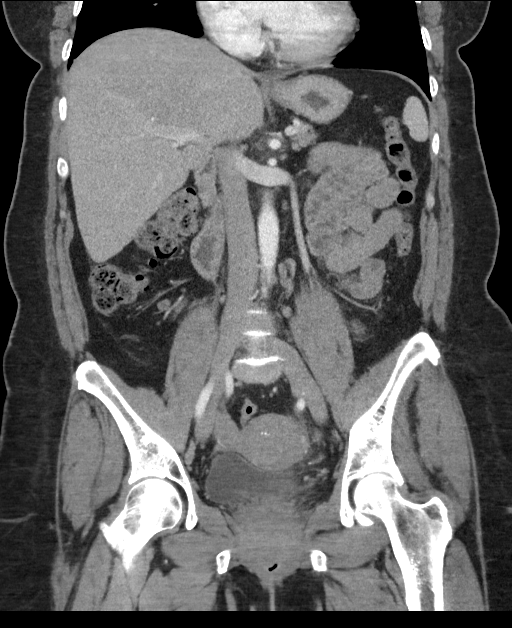

[16 of 46 positions shown; findings below may reference images not displayed]

FINDINGS: Lower Chest: No acute findings.

Hepatobiliary: No hepatic masses identified. Gallbladder is
unremarkable. No evidence of biliary ductal dilatation.

Pancreas:  No mass or inflammatory changes.

Spleen: Within normal limits in size and appearance.

Adrenals/Urinary Tract: No masses identified. A few small renal
cysts are seen bilaterally. No evidence of ureteral calculi or
hydronephrosis.

Stomach/Bowel: Moderate diverticulitis is seen involving the
proximal sigmoid colon. No evidence of perforation or abscess. No
evidence of bowel obstruction.

Vascular/Lymphatic: No pathologically enlarged lymph nodes. No acute
vascular findings.

Reproductive:  No mass or other significant abnormality.

Other:  None.

Musculoskeletal:  No suspicious bone lesions identified.
IMPRESSION: Moderate sigmoid diverticulitis.

No evidence of abscess or other complication.

## 2023-04-08 ENCOUNTER — Other Ambulatory Visit: Payer: Self-pay

## 2023-04-08 DIAGNOSIS — Z1231 Encounter for screening mammogram for malignant neoplasm of breast: Secondary | ICD-10-CM

## 2023-06-15 ENCOUNTER — Ambulatory Visit
Admission: RE | Admit: 2023-06-15 | Discharge: 2023-06-15 | Disposition: A | Discharge status: Home / Self Care | Payer: Medicare PPO | Source: Ambulatory Visit | Attending: Family Medicine | Admitting: Family Medicine

## 2023-06-15 DIAGNOSIS — Z1231 Encounter for screening mammogram for malignant neoplasm of breast: Secondary | ICD-10-CM | POA: Insufficient documentation

## 2023-08-17 ENCOUNTER — Other Ambulatory Visit: Payer: Self-pay | Admitting: Student

## 2023-08-17 DIAGNOSIS — M5441 Lumbago with sciatica, right side: Secondary | ICD-10-CM

## 2023-08-17 DIAGNOSIS — M5416 Radiculopathy, lumbar region: Secondary | ICD-10-CM

## 2023-08-30 ENCOUNTER — Other Ambulatory Visit: Payer: Self-pay | Admitting: Student

## 2023-08-30 ENCOUNTER — Encounter: Payer: Self-pay | Admitting: Student

## 2023-08-31 ENCOUNTER — Ambulatory Visit
Admission: RE | Admit: 2023-08-31 | Discharge: 2023-08-31 | Disposition: A | Discharge status: Home / Self Care | Payer: Medicare PPO | Source: Ambulatory Visit | Attending: Student | Admitting: Student

## 2023-08-31 DIAGNOSIS — M5441 Lumbago with sciatica, right side: Secondary | ICD-10-CM

## 2023-08-31 DIAGNOSIS — M5416 Radiculopathy, lumbar region: Secondary | ICD-10-CM

## 2023-12-10 ENCOUNTER — Ambulatory Visit
Admission: RE | Admit: 2023-12-10 | Discharge: 2023-12-10 | Disposition: A | Discharge status: Home / Self Care | Payer: Medicare PPO | Source: Ambulatory Visit | Attending: Nurse Practitioner

## 2023-12-10 ENCOUNTER — Other Ambulatory Visit: Payer: Self-pay

## 2023-12-10 VITALS — BP 154/87 | HR 79 | Temp 98.0°F | Resp 20

## 2023-12-10 DIAGNOSIS — J209 Acute bronchitis, unspecified: Secondary | ICD-10-CM

## 2023-12-10 LAB — POC COVID19/FLU A&B COMBO
Covid Antigen, POC: NEGATIVE
Influenza A Antigen, POC: NEGATIVE
Influenza B Antigen, POC: NEGATIVE

## 2023-12-10 MED ORDER — PREDNISONE 20 MG PO TABS: 40.0000 mg | ORAL_TABLET | Freq: Every day | ORAL | 0 refills | Status: AC

## 2023-12-10 MED ORDER — ALBUTEROL SULFATE HFA 108 (90 BASE) MCG/ACT IN AERS: 2.0000 | INHALATION_SPRAY | Freq: Four times a day (QID) | RESPIRATORY_TRACT | 0 refills | Status: AC | PRN

## 2023-12-10 MED ORDER — PROMETHAZINE-DM 6.25-15 MG/5ML PO SYRP: 5.0000 mL | ORAL_SOLUTION | Freq: Four times a day (QID) | ORAL | 0 refills | Status: AC | PRN

## 2023-12-10 NOTE — Discharge Instructions (Addendum)
The COVID/flu test was negative. Take medication as prescribed. Increase fluids and allow for plenty of rest. May take over-the-counter Tylenol or ibuprofen as needed for pain, fever, or general discomfort. Recommend using a humidifier in your bedroom at nighttime during sleep and sleeping elevated on pillows while cough symptoms persist. As discussed, if your cough appears to be worsening, or if you develop new symptoms such as shortness of breath, or have difficulty breathing, you may follow-up in this clinic or with your primary care physician for further evaluation. Follow-up as needed.

## 2023-12-10 NOTE — ED Provider Notes (Signed)
[facility]    CSN: [id number] Arrival date & time: 12/10/23  1427      History   Chief Complaint Chief Complaint  Patient presents with   Cough    Extremely bad cough,headaches, low-grade fever - Entered by patient    HPI Melissa Aguilar is a 68 y.o. female.   The history is provided by the patient.   Patient presents with a 3-day history of bodyaches, runny nose, sore throat, headache, and cough.  She also states that she has had a fever, but did not check it at home.  Denies ear pain, ear drainage, wheezing, difficulty breathing, chest pain, abdominal pain, nausea, vomiting, diarrhea, or rash.  Reports she has been taking several over-the-counter cough and cold medications with minimal relief.  Patient states cough is worse at night.  States her husband has been sick with the same or similar symptoms.  Patient with history of diabetes, last A1c was 6.5 in November.  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 Authorizing Provider  Ascorbic Acid (VITAMIN C) 500 MG CHEW Chew by mouth.    [provider]  Cholecalciferol (VITAMIN D3) 50  MCG (2000 UT) capsule Take by mouth.    [provider]  dicyclomine (BENTYL) 20 MG tablet Take 20 mg by mouth 4 (four) times daily.    [provider]  ondansetron (ZOFRAN ODT) 4 MG disintegrating tablet Take 1 tablet (4 mg total) by mouth every 8 (eight) hours as needed for nausea or vomiting. 07/24/21   Concha Se, MD  promethazine-dextromethorphan (PROMETHAZINE-DM) 6.25-15 MG/5ML syrup Take 5 mLs by mouth 4 (four) times daily as needed. 12/23/21   Rhys Martini, PA-C  vitamin B-12 (CYANOCOBALAMIN) 1000 MCG tablet Take by mouth.    [provider]    Family History Family History  Problem Relation Age of Onset   Heart disease Father    Hypertension Father    Breast cancer Maternal Aunt 43       great aunt   Multiple myeloma Mother 85    Social History Social History   Tobacco Use   Smoking status: Never   Smokeless tobacco: Never  Vaping Use   Vaping status: Never Used  Substance Use Topics   Alcohol use:  Yes    Comment: occasional   Drug use: No     Allergies   Amoxil [amoxicillin], Augmentin [amoxicillin-pot clavulanate], Paxil [paroxetine hcl], and Tamiflu [oseltamivir phosphate]   Review of Systems Review of Systems Per HPI  Physical Exam Triage Vital Signs ED Triage Vitals  Encounter Vitals Group     BP 12/10/23 1452 (!) 154/87     Systolic BP Percentile --      Diastolic BP Percentile --      Pulse Rate 12/10/23 1452 79     Resp 12/10/23 1452 20     Temp 12/10/23 1452 98 F (36.7 C)     Temp Source 12/10/23 1452 Oral     SpO2 12/10/23 1452 96 %     Weight --      Height --      Head Circumference --      Peak Flow --      Pain Score 12/10/23 1450 10     Pain Loc --      Pain Education --      Exclude from Growth Chart --    No data found.  Updated Vital Signs BP (!) 154/87 (BP Location: Right Arm)   Pulse 79   Temp 98 F (36.7 C) (Oral)   Resp 20   SpO2 96%   Visual Acuity Right Eye Distance:   Left Eye  Distance:   Bilateral Distance:    Right Eye Near:   Left Eye Near:    Bilateral Near:     Physical Exam Vitals and nursing note reviewed.  Constitutional:      General: She is not in acute distress.    Appearance: Normal appearance.  HENT:     Head: Normocephalic.     Right Ear: Tympanic membrane, ear canal and external ear normal.     Left Ear: Tympanic membrane, ear canal and external ear normal.     Nose: Congestion present.     Mouth/Throat:     Lips: Pink.     Mouth: Mucous membranes are moist.     Pharynx: Uvula midline. Posterior oropharyngeal erythema and postnasal drip present. No pharyngeal swelling, oropharyngeal exudate or uvula swelling.     Comments: Cobblestoning present to posterior oropharynx  Eyes:     Extraocular Movements: Extraocular movements intact.     Conjunctiva/sclera: Conjunctivae normal.     Pupils: Pupils are equal, round, and reactive to light.  Cardiovascular:     Rate and Rhythm: Normal rate.     Pulses: Normal pulses.     Heart sounds: Normal heart sounds.  Pulmonary:     Effort: Pulmonary effort is normal. No respiratory distress.     Breath sounds: Normal breath sounds. No stridor. No wheezing, rhonchi or rales.  Abdominal:     General: Bowel sounds are normal.     Palpations: Abdomen is soft.     Tenderness: There is no abdominal tenderness.  Musculoskeletal:     Cervical back: Normal range of motion.  Skin:    General: Skin is warm and dry.  Neurological:     General: No focal deficit present.     Mental Status: She is alert and oriented to person, place, and time.  Psychiatric:        Mood and Affect: Mood normal.        Behavior: Behavior normal.      UC Treatments / Results  Labs (all labs ordered are listed, but only abnormal results are displayed) Labs  Reviewed  POC COVID19/FLU A&B COMBO    EKG   Radiology No results found.  Procedures Procedures (including critical care time)  Medications Ordered in  UC Medications - No data to display  Initial Impression / Assessment and Plan / UC Course  I have reviewed the triage vital signs and the nursing notes.  Pertinent labs & imaging results that were available during my care of the patient were reviewed by me and considered in my medical decision making (see chart for details).  On exam, lung sounds are clear throughout, room air sats at 96%.  COVID/flu test was negative.  Will treat for acute bronchitis with prednisone 40 mg, Promethazine DM for cough, and an albuterol inhaler as needed for cough, wheezing, shortness of breath.  Supportive care recommendations were provided and discussed with the patient to include fluids, rest, over-the-counter analgesics, and use of a humidifier at nighttime during sleep.  Discussed indications with the patient regarding follow-up.  Patient was in agreement with this plan of care and verbalizes understanding.  All questions were answered.  Patient stable for discharge.  Final Clinical Impressions(s) / UC Diagnoses   Final diagnoses:  None   Discharge Instructions   None    ED Prescriptions   None    PDMP not reviewed this encounter.   Abran Cantor, NP 12/10/23 [phone]

## 2023-12-10 NOTE — ED Triage Notes (Signed)
Pt reports cough, runny nose, headache, body aches, sore throat for last few days.

## 2024-04-19 ENCOUNTER — Ambulatory Visit: Admission: EM | Admit: 2024-04-19 | Discharge: 2024-04-19 | Disposition: A | Discharge status: Home / Self Care

## 2024-04-19 ENCOUNTER — Encounter: Payer: Self-pay | Admitting: Emergency Medicine

## 2024-04-19 ENCOUNTER — Other Ambulatory Visit: Payer: Self-pay

## 2024-04-19 DIAGNOSIS — R21 Rash and other nonspecific skin eruption: Secondary | ICD-10-CM

## 2024-04-19 MED ORDER — DEXAMETHASONE SODIUM PHOSPHATE 10 MG/ML IJ SOLN
10.0000 mg | INTRAMUSCULAR | Status: AC
  Administered 2024-04-19: 10 mg via INTRAMUSCULAR

## 2024-04-19 MED ORDER — PREDNISONE 20 MG PO TABS
40.0000 mg | ORAL_TABLET | Freq: Every day | ORAL | 0 refills | Status: AC
Start: 2024-04-19 — End: 2024-04-24

## 2024-04-19 MED ORDER — TRIAMCINOLONE ACETONIDE 0.1 % EX CREA: 1.0000 | TOPICAL_CREAM | Freq: Two times a day (BID) | CUTANEOUS | 0 refills | Status: AC

## 2024-04-19 NOTE — Discharge Instructions (Addendum)
 You were given an injection of Decadron 10mg . Start the prednisone  on 04/20/24. Take medication as prescribed. May also take over-the-counter Zyrtec during the daytime and Benadryl at bedtime to help with itching. Avoid hot baths or showers while symptoms persist.  Recommend taking lukewarm baths. May apply cool cloths to the area to help with itching or discomfort. Avoid scratching, rubbing, or manipulating the areas while symptoms persist. Recommend Aveeno Colloidal Oatmeal Bath to use to help with drying and itching. Follow up if symptoms do not improve.

## 2024-04-19 NOTE — ED Triage Notes (Signed)
 Pt reports poison ivy blisters for last several days. Has tried otc creams and ointments and denies any improvement of discomfort.

## 2024-04-19 NOTE — ED Provider Notes (Signed)
 [facility]    CSN: [id number] Arrival date & time: 04/19/24  1549      History   Chief Complaint Chief Complaint  Patient presents with   Skin Problem    HPI Melissa Aguilar is a 68 y.o. female.   The history is provided by the patient.   Patient presents with a several day history of poison ivy to her lower extremities.  Patient states she was out in the yard clearing brush prior to her symptoms starting.  States that she has poison ivy that has since transitioned to blisters.  She notes various areas on her lower extremities and on her abdomen.  States that she has used several over-the-counter medications for her symptoms with minimal relief.  Denies fever, chills, chest pain, abdominal pain, oozing, or drainage from the sites.  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 Authorizing Provider  predniSONE  (DELTASONE ) 20 MG tablet Take 2 tablets (40 mg total) by mouth daily with breakfast for 5 days. 04/19/24 04/24/24 Yes Leath-Warren, Belen Bowers, NP  tiZANidine (ZANAFLEX) 2 MG tablet Take 2 mg by mouth. 11/11/23 11/10/24 Yes [provider]   triamcinolone  cream (KENALOG ) 0.1 % Apply 1 Application topically 2 (two) times daily. 04/19/24  Yes Leath-Warren, Belen Bowers, NP  albuterol  (VENTOLIN  HFA) 108 (90 Base) MCG/ACT inhaler Inhale 2 puffs into the lungs every 6 (six) hours as needed. 12/10/23   Leath-Warren, Belen Bowers, NP  Ascorbic Acid (VITAMIN C) 500 MG CHEW Chew by mouth.    [provider]  Cholecalciferol (VITAMIN D3) 50 MCG (2000 UT) capsule Take by mouth.    [provider]  dicyclomine (BENTYL) 20 MG tablet Take 20 mg by mouth 4 (four) times daily.    [provider]  gabapentin (NEURONTIN) 300 MG capsule Take 300 mg by mouth 3 (three) times daily.    [provider]  ondansetron  (ZOFRAN  ODT) 4 MG disintegrating tablet Take 1 tablet (4 mg total) by mouth every 8 (eight) hours as needed for nausea or vomiting. 07/24/21   Lubertha Rush, MD  promethazine -dextromethorphan (PROMETHAZINE -DM) 6.25-15 MG/5ML syrup Take 5 mLs by mouth 4 (four) times daily as needed. 12/10/23   Leath-Warren, Belen Bowers, NP  vitamin B-12 (CYANOCOBALAMIN) 1000 MCG tablet Take by  mouth.    [provider]    Family History Family History  Problem Relation Age of Onset   Heart disease Father    Hypertension Father    Breast cancer Maternal Aunt 81       great aunt   Multiple myeloma Mother 57    Social History Social History   Tobacco Use   Smoking status: Never   Smokeless tobacco: Never  Vaping Use   Vaping status: Never Used  Substance Use Topics   Alcohol use: Yes    Comment: occasional   Drug use: No     Allergies   Amoxil [amoxicillin], Augmentin [amoxicillin-pot clavulanate], Paxil [paroxetine hcl], and Tamiflu  [oseltamivir  phosphate]   Review of Systems Review of Systems Per HPI  Physical Exam Triage Vital Signs ED Triage Vitals  Encounter Vitals Group     BP 04/19/24 1622 (!) 170/72     Systolic BP Percentile --      Diastolic BP Percentile --      Pulse Rate 04/19/24 1622  76     Resp 04/19/24 1622 20     Temp 04/19/24 1622 97.8 F (36.6 C)     Temp Source 04/19/24 1622 Oral     SpO2 04/19/24 1622 98 %     Weight --      Height --      Head Circumference --      Peak Flow --      Pain Score 04/19/24 1624 0     Pain Loc --      Pain Education --      Exclude from Growth Chart --    No data found.  Updated Vital Signs BP (!) 170/72 (BP Location: Right Arm)   Pulse 76   Temp 97.8 F (36.6 C) (Oral)   Resp 20   SpO2 98%   Visual Acuity Right Eye Distance:   Left Eye Distance:   Bilateral Distance:    Right Eye Near:   Left Eye Near:    Bilateral Near:     Physical Exam Vitals and nursing note reviewed.  Constitutional:      General: She is not in acute distress.    Appearance: Normal appearance.  HENT:     Head: Normocephalic.  Eyes:     Extraocular Movements: Extraocular movements intact.     Pupils: Pupils are equal, round, and reactive to light.  Pulmonary:     Effort: Pulmonary effort is normal.  Musculoskeletal:     Cervical back: Normal range of motion.  Skin:    General: Skin is warm and dry.     Findings: Rash present. Rash is macular and papular.     Comments: Diffuse linear maculopapular rash noted to the bilateral lower extremities and abdomen in no from groin pattern.  Blistering is present.  There is no oozing or drainage present.  Neurological:     General: No focal deficit present.     Mental Status: She is alert and oriented to person, place, and time.  Psychiatric:        Mood and Affect: Mood normal.        Behavior: Behavior normal.      UC Treatments / Results  Labs (all labs ordered are listed, but only abnormal results are displayed) Labs Reviewed - No data to display  EKG   Radiology No results found.  Procedures Procedures (including critical care time)  Medications Ordered in UC Medications  dexamethasone (DECADRON) injection 10 mg (  10 mg Intramuscular Given 04/19/24 1640)    Initial  Impression / Assessment and Plan / UC Course  I have reviewed the triage vital signs and the nursing notes.  Pertinent labs & imaging results that were available during my care of the patient were reviewed by me and considered in my medical decision making (see chart for details).  Symptoms consistent with poison ivy.  Will treat with Decadron 10 mg IM.  Start triamcinolone  cream 0.1% and prednisone  40 mg.  Supportive care recommendations were provided and discussed with the patient to include over-the-counter antihistamines, cool compresses to the affected area, and use of Aveeno colloidal oatmeal bath to help with itching and drying.  Discussed indications with patient regarding follow-up.  Patient was in agreement with this plan of care and verbalizes understanding.  All questions were answered.  Patient stable for discharge.  Final Clinical Impressions(s) / UC Diagnoses   Final diagnoses:  Rash and nonspecific skin eruption     Discharge Instructions      You were given an injection of Decadron 10mg . Start the prednisone  on 04/20/24. Take medication as prescribed. May also take over-the-counter Zyrtec during the daytime and Benadryl at bedtime to help with itching. Avoid hot baths or showers while symptoms persist.  Recommend taking lukewarm baths. May apply cool cloths to the area to help with itching or discomfort. Avoid scratching, rubbing, or manipulating the areas while symptoms persist. Recommend Aveeno Colloidal Oatmeal Bath to use to help with drying and itching. Follow up if symptoms do not improve.     ED Prescriptions     Medication Sig Dispense Auth. Provider   predniSONE  (DELTASONE ) 20 MG tablet Take 2 tablets (40 mg total) by mouth daily with breakfast for 5 days. 10 tablet Leath-Warren, Belen Bowers, NP   triamcinolone  cream (KENALOG ) 0.1 % Apply 1 Application topically 2 (two) times daily. 80 g Leath-Warren, Belen Bowers, NP      PDMP not reviewed this encounter.    Hardy Lia, NP 04/19/24 1647

## 2024-06-06 ENCOUNTER — Other Ambulatory Visit: Payer: Self-pay | Admitting: Family Medicine

## 2024-06-06 DIAGNOSIS — Z1231 Encounter for screening mammogram for malignant neoplasm of breast: Secondary | ICD-10-CM

## 2024-06-27 ENCOUNTER — Ambulatory Visit
Admission: RE | Admit: 2024-06-27 | Discharge: 2024-06-27 | Disposition: A | Discharge status: Home / Self Care | Source: Ambulatory Visit | Attending: Family Medicine | Admitting: Family Medicine

## 2024-06-27 DIAGNOSIS — Z1231 Encounter for screening mammogram for malignant neoplasm of breast: Secondary | ICD-10-CM | POA: Diagnosis present

## 2024-10-21 ENCOUNTER — Emergency Department (HOSPITAL_COMMUNITY)

## 2024-10-21 ENCOUNTER — Other Ambulatory Visit: Payer: Self-pay

## 2024-10-21 ENCOUNTER — Encounter (HOSPITAL_COMMUNITY): Payer: Self-pay

## 2024-10-21 ENCOUNTER — Emergency Department (HOSPITAL_COMMUNITY)
Admission: EM | Admit: 2024-10-21 | Discharge: 2024-10-21 | Disposition: A | Discharge status: Home / Self Care | Attending: Emergency Medicine | Admitting: Emergency Medicine

## 2024-10-21 DIAGNOSIS — N201 Calculus of ureter: Secondary | ICD-10-CM

## 2024-10-21 DIAGNOSIS — N132 Hydronephrosis with renal and ureteral calculous obstruction: Secondary | ICD-10-CM | POA: Insufficient documentation

## 2024-10-21 LAB — CBC WITH DIFFERENTIAL/PLATELET
Abs Immature Granulocytes: 0.07 K/uL (ref 0.00–0.07)
Basophils Absolute: 0.1 K/uL (ref 0.0–0.1)
Basophils Relative: 1 %
Eosinophils Absolute: 0.2 K/uL (ref 0.0–0.5)
Eosinophils Relative: 1 %
HCT: 45.1 % (ref 36.0–46.0)
Hemoglobin: 15.4 g/dL — ABNORMAL HIGH (ref 12.0–15.0)
Immature Granulocytes: 1 %
Lymphocytes Relative: 13 %
Lymphs Abs: 1.6 K/uL (ref 0.7–4.0)
MCH: 28.8 pg (ref 26.0–34.0)
MCHC: 34.1 g/dL (ref 30.0–36.0)
MCV: 84.3 fL (ref 80.0–100.0)
Monocytes Absolute: 0.5 K/uL (ref 0.1–1.0)
Monocytes Relative: 4 %
Neutro Abs: 9.8 K/uL — ABNORMAL HIGH (ref 1.7–7.7)
Neutrophils Relative %: 80 %
Platelets: 261 K/uL (ref 150–400)
RBC: 5.35 MIL/uL — ABNORMAL HIGH (ref 3.87–5.11)
RDW: 12.4 % (ref 11.5–15.5)
WBC: 12.2 K/uL — ABNORMAL HIGH (ref 4.0–10.5)
nRBC: 0 % (ref 0.0–0.2)

## 2024-10-21 LAB — COMPREHENSIVE METABOLIC PANEL WITH GFR
ALT: 61 U/L — ABNORMAL HIGH (ref 0–44)
AST: 34 U/L (ref 15–41)
Albumin: 4.7 g/dL (ref 3.5–5.0)
Alkaline Phosphatase: 117 U/L (ref 38–126)
Anion gap: 10 (ref 5–15)
BUN: 19 mg/dL (ref 8–23)
CO2: 28 mmol/L (ref 22–32)
Calcium: 9.8 mg/dL (ref 8.9–10.3)
Chloride: 102 mmol/L (ref 98–111)
Creatinine, Ser: 1 mg/dL (ref 0.44–1.00)
GFR, Estimated: >60 mL/min (ref >60)
Glucose, Bld: 147 mg/dL — ABNORMAL HIGH (ref 70–99)
Potassium: 4.3 mmol/L (ref 3.5–5.1)
Sodium: 140 mmol/L (ref 135–145)
Total Bilirubin: 0.5 mg/dL (ref 0.0–1.2)
Total Protein: 7.4 g/dL (ref 6.5–8.1)

## 2024-10-21 LAB — URINALYSIS, ROUTINE W REFLEX MICROSCOPIC
Bacteria, UA: NONE SEEN
Bilirubin Urine: NEGATIVE
Glucose, UA: NEGATIVE mg/dL
Ketones, ur: NEGATIVE mg/dL
Nitrite: NEGATIVE
Protein, ur: NEGATIVE mg/dL
RBC / HPF: >50 RBC/hpf (ref 0–5)
Specific Gravity, Urine: 1.029 (ref 1.005–1.030)
pH: 8 (ref 5.0–8.0)

## 2024-10-21 LAB — LIPASE, BLOOD: Lipase: 27 U/L (ref 11–51)

## 2024-10-21 MED ORDER — ONDANSETRON HCL 4 MG/2ML IJ SOLN
4.0000 mg | Freq: Once | INTRAMUSCULAR | Status: AC
  Administered 2024-10-21: 4 mg via INTRAVENOUS
  Filled 2024-10-21: qty 2

## 2024-10-21 MED ORDER — TAMSULOSIN HCL 0.4 MG PO CAPS: 0.4000 mg | ORAL_CAPSULE | Freq: Every day | ORAL | 0 refills | Status: AC

## 2024-10-21 MED ORDER — HYDROMORPHONE HCL 1 MG/ML IJ SOLN
1.0000 mg | Freq: Once | INTRAMUSCULAR | Status: AC
  Administered 2024-10-21: 1 mg via INTRAVENOUS
  Filled 2024-10-21: qty 1

## 2024-10-21 MED ORDER — IOHEXOL 300 MG/ML  SOLN
100.0000 mL | Freq: Once | INTRAMUSCULAR | Status: AC | PRN
  Administered 2024-10-21: 100 mL via INTRAVENOUS

## 2024-10-21 MED ORDER — OXYCODONE-ACETAMINOPHEN 5-325 MG PO TABS: 1.0000 | ORAL_TABLET | Freq: Four times a day (QID) | ORAL | 0 refills | Status: AC | PRN

## 2024-10-21 MED ORDER — KETOROLAC TROMETHAMINE 30 MG/ML IJ SOLN
15.0000 mg | Freq: Once | INTRAMUSCULAR | Status: AC
  Administered 2024-10-21: 15 mg via INTRAVENOUS
  Filled 2024-10-21: qty 1

## 2024-10-21 MED ORDER — TAMSULOSIN HCL 0.4 MG PO CAPS
0.4000 mg | ORAL_CAPSULE | Freq: Once | ORAL | Status: AC
  Administered 2024-10-21: 0.4 mg via ORAL
  Filled 2024-10-21: qty 1

## 2024-10-21 MED ORDER — SODIUM CHLORIDE 0.9 % IV BOLUS: 1000.0000 mL | Freq: Once | INTRAVENOUS | Status: DC

## 2024-10-21 NOTE — Discharge Instructions (Addendum)
 Thank you for visiting the Emergency Department today. It was a pleasure to be part of your healthcare team.  You were seen today for kidney stones.  As discussed, you have a small kidney stone identified on imaging. You will be prescribed Flomax  for stone passage and pain relief, and also a short course of pain medication. If you have any questions about your medicines, please call your pharmacy or healthcare provider. At home, rest, hydrate, take Tylenol  and ibuprofen as needed for pain management.  You may take ibuprofen around 10 PM if needed. It is important to watch for warning signs such as worsening pain or fever. If any of these happen, return to the Emergency Department or call 911. Thank you for trusting us  with your health.

## 2024-10-21 NOTE — ED Provider Notes (Signed)
 [facility] EMER[facility]ENC[facility] DEPARTMENT AT [facility] Provider Note   CSN: [id number] Arrival date & time: 10/21/24  1254     Patient presents with: Abdominal Pain   Melissa Aguilar is a 68 y.o. female who presents to the ED with abdominal pain that began around 1100. The symptoms started with right upper quadrant wax/wane pain and have been progressively worsening since onset. The patient describes the symptoms as sharp and rates the severity as 10/10. Associated symptoms include nausea. The patient reports no chest pain, shortness of breath, back pain, urinary symptoms, vomiting, diarrhea, fevers.  Patient states that she does have a history of gallstones and this feels similar.  No recent travel. No sick contacts. The patients social history is notable for ***.    {Add pertinent medical, surgical, social history, OB history to HPI:32947}  Abdominal Pain      Prior to Admission medications  Medication Sig Start Date End Date Taking? Authorizing Provider  albuterol  (VENTOLIN  HFA) 108 (90 Base) MCG/ACT inhaler Inhale 2 puffs into the lungs every 6 (six) hours as needed. 12/10/23   Leath-Warren, Etta PARAS, NP  Ascorbic Acid (VITAMIN C) 500 MG CHEW Chew by mouth.    [provider]  Cholecalciferol (VITAMIN D3) 50 MCG (2000 UT) capsule Take by mouth.    [provider]  dicyclomine (BENTYL) 20 MG tablet Take 20 mg by mouth 4 (four) times daily.    [provider]  gabapentin (NEURONTIN) 300 MG capsule Take 300 mg by mouth 3 (three) times daily.    [provider]  ondansetron  (ZOFRAN  ODT) 4 MG disintegrating tablet Take 1 tablet (4 mg total) by mouth every 8 (eight) hours as needed for nausea or vomiting. 07/24/21   Ernest Ronal BRAVO, MD  promethazine -dextromethorphan (PROMETHAZINE -DM) 6.25-15 MG/5ML syrup Take 5 mLs by mouth 4 (four) times daily as needed. 12/10/23   Leath-Warren, Etta PARAS, NP  tiZANidine (ZANAFLEX) 2 MG tablet Take 2 mg by mouth.  11/11/23 11/10/24  [provider]  triamcinolone  cream (KENALOG ) 0.1 % Apply 1 Application topically 2 (two) times daily. 04/19/24   Leath-Warren, Etta PARAS, NP  vitamin B-12 (CYANOCOBALAMIN) 1000 MCG tablet Take by mouth.    [provider]    Allergies: Amoxil [amoxicillin], Augmentin [amoxicillin-pot clavulanate], Paxil [paroxetine hcl], and Tamiflu  [oseltamivir  phosphate]    Review of Systems  Gastrointestinal:  Positive for abdominal pain.    Updated Vital Signs Pulse 98   Temp 97.8 F (36.6 C) (Axillary)   Resp 18   Ht 5' 5 (1.651 m)   Wt 92.5 kg   SpO2 100%   BMI 33.93 kg/m   Physical Exam  (all labs ordered are listed, but only abnormal results are displayed) Labs Reviewed  CBC WITH DIFFERENTIAL/PLATELET  COMPREHENSIVE METABOLIC PANEL WITH GFR  LIPASE, BLOOD    EKG: None  Radiology: No results found.  {Document cardiac monitor, telemetry assessment procedure when appropriate:32947} Procedures   Medications Ordered in the ED - No data to display  Clinical Course as of 10/21/24 1615  Sat Oct 21, 2024  1314 Temp: 97.8 F (36.6 C) Afebrile, vital stable, patient in notable pain on exam  [ML]  1344 CBC with Differential(!) Leukocytosis [ML]  1344 Dilaudid  1 mg and Zofran  4 mg given for symptomatic relief [ML]  1344 BP(!): 204/103 Hypertensive prior to pain management. [ML]  1402 Comprehensive metabolic panel(!) No acute findings [ML]  1407 Lipase, blood WNL [ML]  1536 CT ABDOMEN PELVIS W CONTRAST Ureterolithiasis  at the right UVJ - hydronephrosis w fat stranding  [ML]  1536 Patient given Toradol  for pain relief - [ML]  1553 Urinalysis, Routine w reflex microscopic -Urine, Clean Catch(!) Large, hematuria, trace leukocytes [ML]    Clinical Course User Index [ML] Willma Duwaine CROME, PA   {Click here for ABCD2, HEART and other calculators REFRESH Note before signing:1}                              Medical Decision Making Amount and/or  Complexity of Data Reviewed Labs: ordered. Decision-making details documented in ED Course. Radiology: ordered. Decision-making details documented in ED Course.  Risk Prescription drug management.   Patient presents to the ED for: *** This involves an extensive number of treatment options {treat1:34121} Differential diagnosis includes: *** Co-morbid conditions: ***  Additional history/records obtained and reviewed: Additional history obtained from  {history 1:34122} External records from outside source obtained and reviewed including ***\  Clinical Course as of 10/21/24 1615  Sat Oct 21, 2024  1314 Temp: 97.8 F (36.6 C) Afebrile, vital stable, patient in notable pain on exam  [ML]  1344 CBC with Differential(!) Leukocytosis [ML]  1344 Dilaudid  1 mg and Zofran  4 mg given for symptomatic relief [ML]  1344 BP(!): 204/103 Hypertensive prior to pain management. [ML]  1402 Comprehensive metabolic panel(!) No acute findings [ML]  1407 Lipase, blood WNL [ML]  1536 CT ABDOMEN PELVIS W CONTRAST Ureterolithiasis at the right UVJ - hydronephrosis w fat stranding  [ML]  1536 Patient given Toradol  for pain relief - [ML]  1553 Urinalysis, Routine w reflex microscopic -Urine, Clean Catch(!) Large, hematuria, trace leukocytes [ML]    Clinical Course User Index [ML] Willma Duwaine CROME, PA    Data Reviewed / Actions Taken: Labs ordered/reviewed with my independent interpretation in ED course above. Imaging ordered/reviewed with my independent interpretation in ED course above. I agree with the radiologists interpretation.  EKG ordered/reviewed with my independent interpretation in ED course above. The patient *** continuous cardiac monitoring during the ED stay. **** Key findings for the patient were reviewed with the attending physician, and ongoing clinical collaboration was maintained throughout the visit  Management / Treatments: See ED course above for medications, treatments  administered, and clinical rationale.   Reevaluation of the patient after these medicines showed that the patient resolved/improved/worsened:23923::improved} I have reviewed the patients home medicines and have made adjustments as needed  Test Considered/Diagnostic tools:  Clinical decision tools such as MDCalc were used in the emergency department to support diagnostic accuracy, risk stratification, and disposition planning: *** Additional diagnostic testing was {add test 1:34123} based on the patients presenting symptoms, risk factors, and initial clinical assessment. ***  ED Course / Reassessments: Problem List:  *** presented for ***. Initial assessment included history, physical exam, and review of prior medical records. Patients physical exam included revealed ***. Laboratory studies, imaging, and other ancillary studies were obtained and key results included ***. Management included ***, with ongoing reassessment. Pain and symptoms were addressed during the visit. Vital signs were obtained and monitored, and the patient remained stable throughout the stay. The patient showed ***  Patient response: *** Serial reassessments performed: {yes/no:20286}    Social determinants impacting care: {social izu:65875}  Consultations:  {consult:34125} Consult recommendations incorporated into plan: ***  Disposition: Disposition: {dispo:34126} Rationale for disposition: *** The disposition plan and rationale were discussed with the patient at the bedside, all questions were addressed, and the patient demonstrated  understanding.  This note was produced using [employer] Engineer. While I have reviewed and verified all clinical information, transcription errors may remain.   {Document critical care time when appropriate  Document review of labs and clinical decision tools ie CHADS2VASC2, etc  Document your independent review of radiology images and any outside records  Document  your discussion with family members, caretakers and with consultants  Document social determinants of health affecting pt's care  Document your decision making why or why not admission, treatments were needed:32947:::1}   Final diagnoses:  None    ED Discharge Orders     None

## 2024-10-21 NOTE — ED Triage Notes (Signed)
 Pt states RUQ pain starting around 1100. Pt states hx of gallstones with same symptoms. Pt states she took a 325 mg percocet before coming to see if it would help with no relief.
